# Patient Record
Sex: Male | Born: 1965 | Race: White | Hispanic: No | Marital: Single | Smoking: Current every day smoker
Health system: Southern US, Community
[De-identification: ages and names within clinical notes are randomized; demographics above are authoritative.]

## PROBLEM LIST (undated history)

## (undated) DIAGNOSIS — Z87442 Personal history of urinary calculi: Secondary | ICD-10-CM

## (undated) DIAGNOSIS — K219 Gastro-esophageal reflux disease without esophagitis: Secondary | ICD-10-CM

## (undated) DIAGNOSIS — E785 Hyperlipidemia, unspecified: Secondary | ICD-10-CM

## (undated) DIAGNOSIS — Z86718 Personal history of other venous thrombosis and embolism: Secondary | ICD-10-CM

## (undated) HISTORY — PX: CHOLECYSTECTOMY: SHX55

## (undated) HISTORY — DX: Personal history of other venous thrombosis and embolism: Z86.718

## (undated) HISTORY — DX: Gastro-esophageal reflux disease without esophagitis: K21.9

## (undated) HISTORY — DX: Hyperlipidemia, unspecified: E78.5

---

## 2004-04-08 HISTORY — PX: ARTERIAL THROMBECTOMY: SHX558

## 2005-03-15 ENCOUNTER — Inpatient Hospital Stay (HOSPITAL_COMMUNITY): Admission: EM | Admit: 2005-03-15 | Discharge: 2005-03-19 | Payer: Self-pay | Admitting: Emergency Medicine

## 2005-03-15 ENCOUNTER — Encounter (INDEPENDENT_AMBULATORY_CARE_PROVIDER_SITE_OTHER): Payer: Self-pay | Admitting: Specialist

## 2005-03-18 ENCOUNTER — Encounter (INDEPENDENT_AMBULATORY_CARE_PROVIDER_SITE_OTHER): Payer: Self-pay | Admitting: Cardiology

## 2005-04-29 ENCOUNTER — Ambulatory Visit (HOSPITAL_COMMUNITY): Admission: RE | Admit: 2005-04-29 | Discharge: 2005-04-29 | Payer: Self-pay | Admitting: Internal Medicine

## 2007-01-28 IMAGING — US US EXTREM LOW VENOUS*L*
1 series · 14 of 22 positions shown · non-contrast
Comparison: none

CLINICAL DATA: Left leg, ankle, and foot swelling and pain.
 LEFT LOWER EXTREMITY VENOUS DOPPLER ULTRASOUND:
TECHNIQUE: Gray-scale sonography with compression, as well as color and duplex Doppler ultrasound, were performed to evaluate the deep venous system from the level of the common femoral vein through the popliteal and proximal calf veins.

[Series 1: unknown · 14 of 22 slices shown]
[im 1/22]
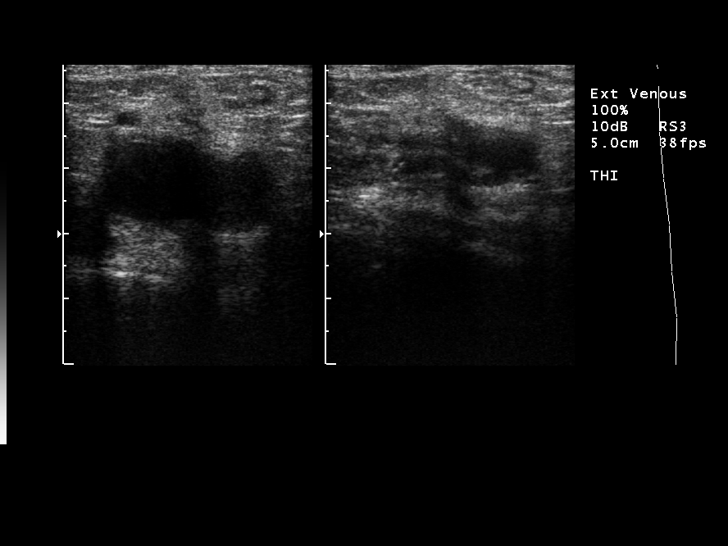
[im 3/22]
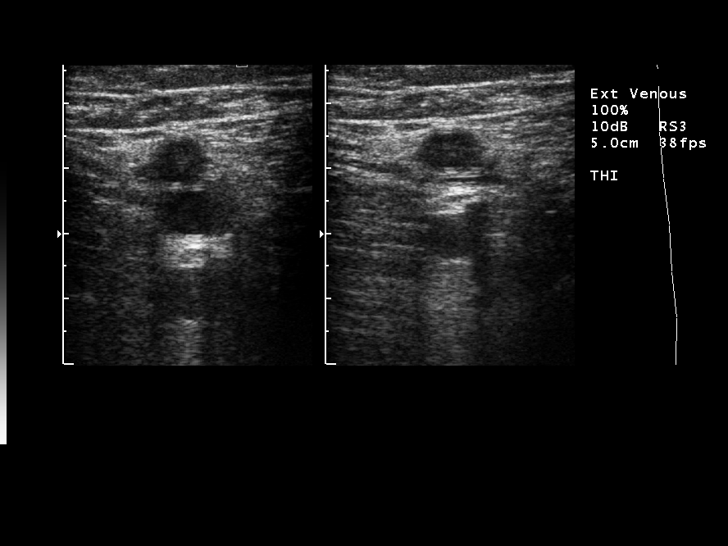
[im 4/22]
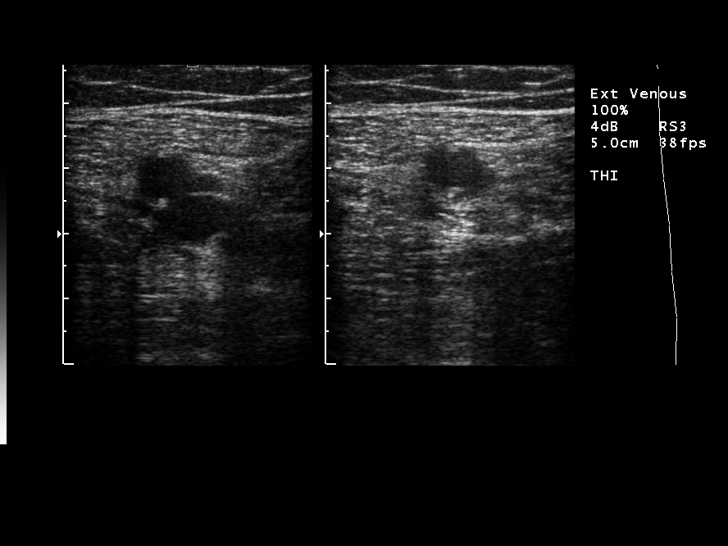
[im 6/22]
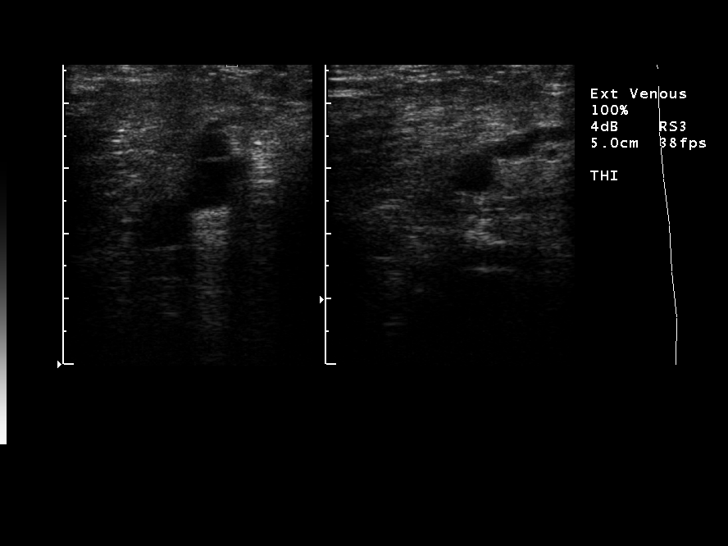
[im 8/22]
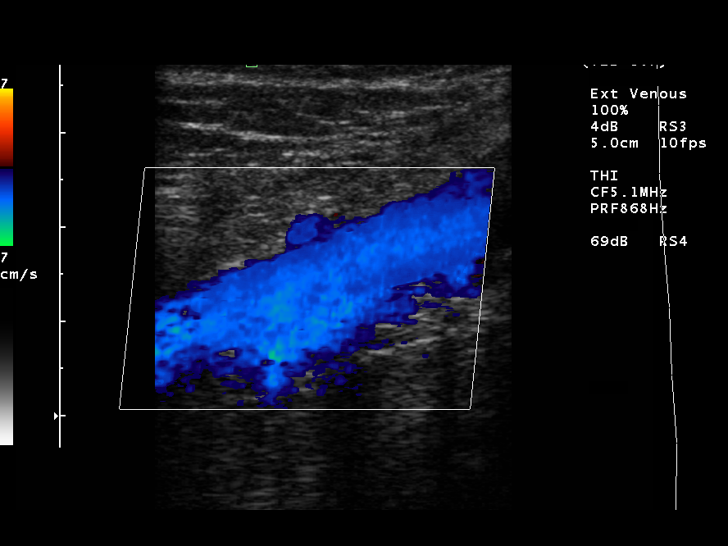
[im 9/22]
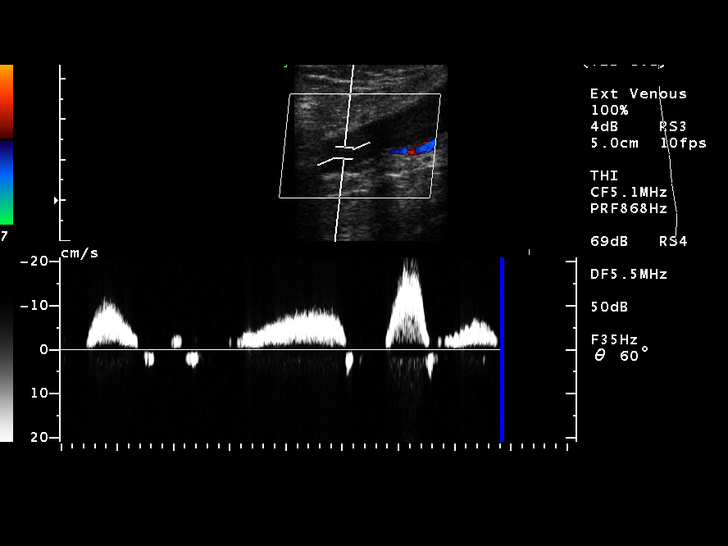
[im 11/22]
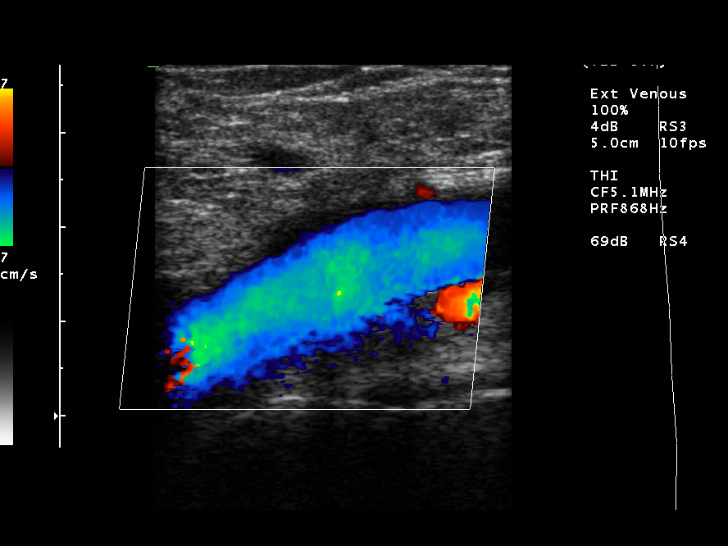
[im 12/22]
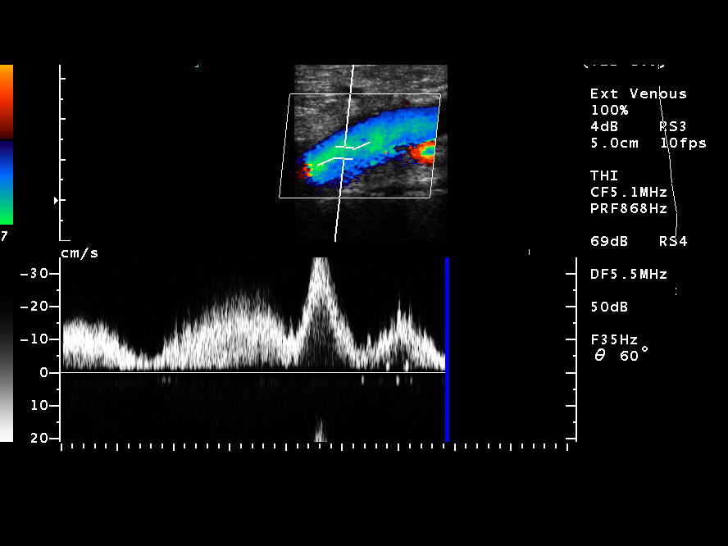
[im 14/22]
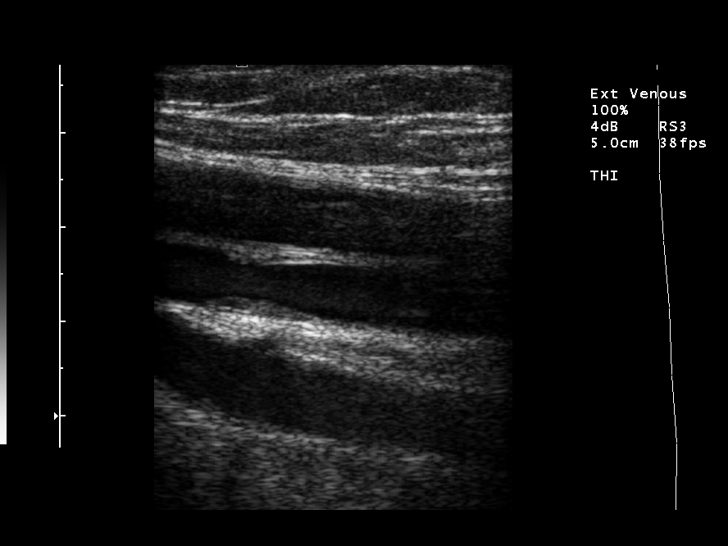
[im 15/22]
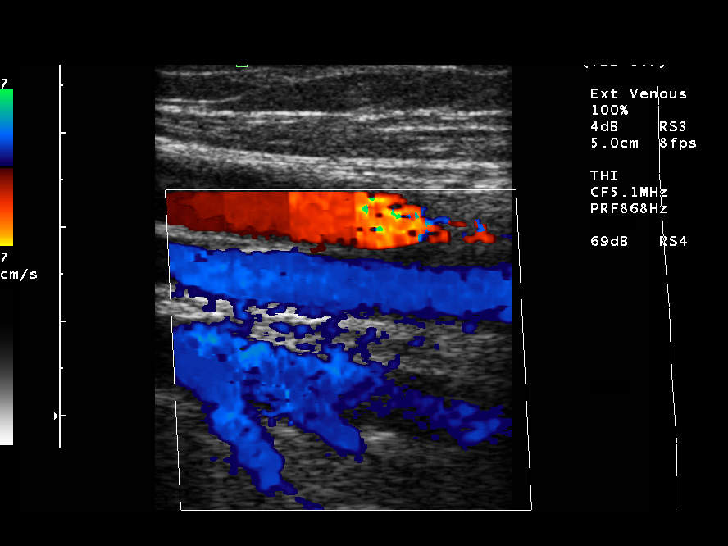
[im 17/22]
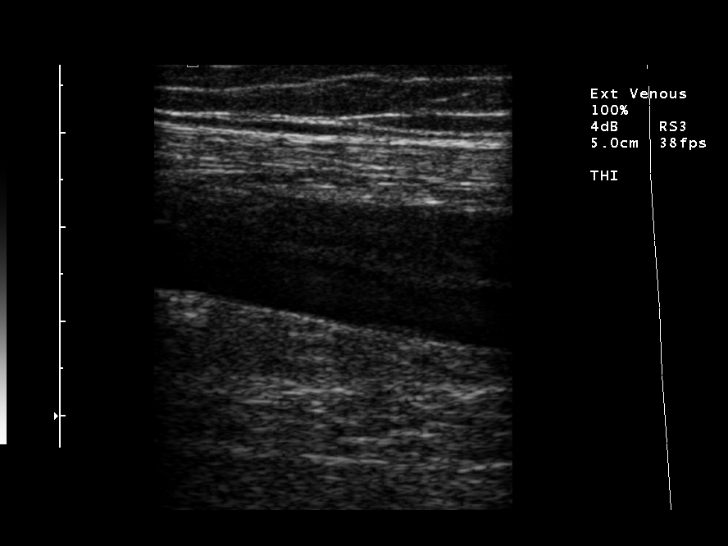
[im 19/22]
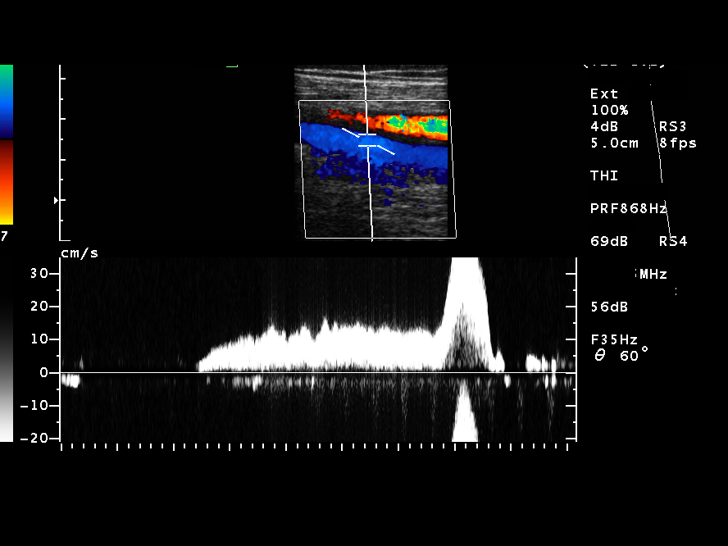
[im 20/22]
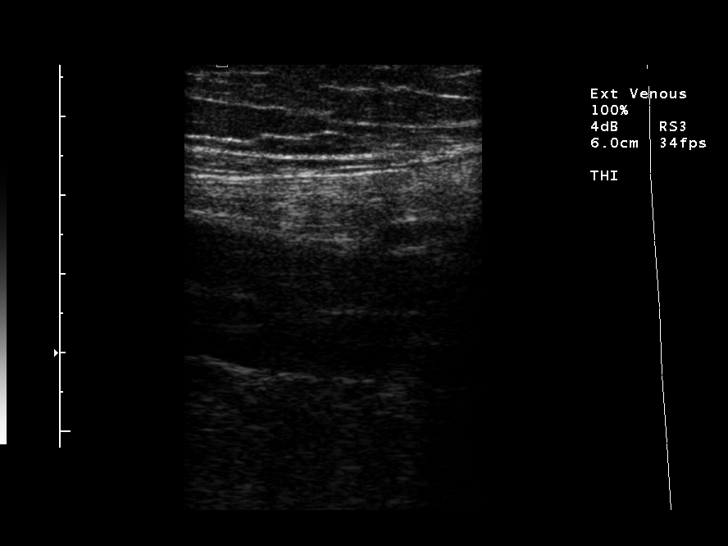
[im 22/22]
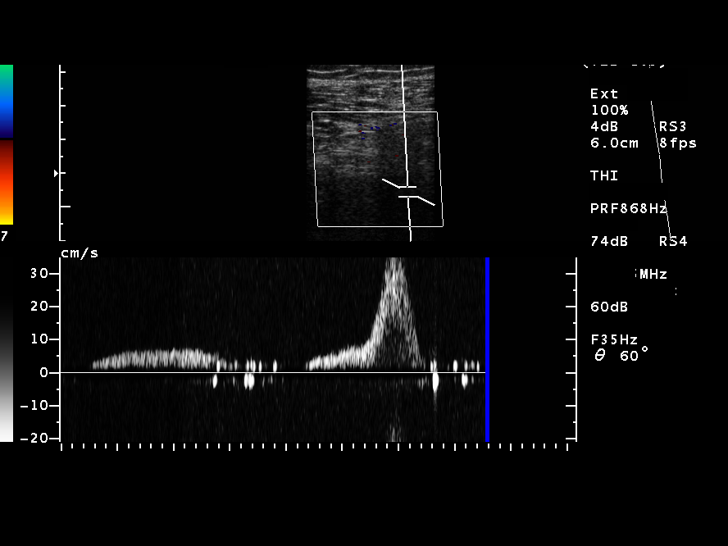

[14 of 22 positions shown; findings below may reference images not displayed]

FINDINGS: There is normal flow, compressibility, and augmentation in the left common femoral, superficial femoral, and popliteal veins.
IMPRESSION: No sonographic evidence of deep venous thrombosis in the visualized veins of the left lower extremity.

## 2007-07-16 ENCOUNTER — Ambulatory Visit (HOSPITAL_COMMUNITY): Admission: RE | Admit: 2007-07-16 | Discharge: 2007-07-16 | Payer: Self-pay | Admitting: Internal Medicine

## 2008-04-26 ENCOUNTER — Emergency Department (HOSPITAL_COMMUNITY): Admission: EM | Admit: 2008-04-26 | Discharge: 2008-04-26 | Payer: Self-pay | Admitting: Emergency Medicine

## 2008-05-17 ENCOUNTER — Encounter (HOSPITAL_COMMUNITY): Admission: RE | Admit: 2008-05-17 | Discharge: 2008-06-16 | Payer: Self-pay | Admitting: Family Medicine

## 2008-05-20 ENCOUNTER — Ambulatory Visit (HOSPITAL_COMMUNITY): Admission: RE | Admit: 2008-05-20 | Discharge: 2008-05-20 | Payer: Self-pay | Admitting: General Surgery

## 2008-05-20 ENCOUNTER — Encounter (INDEPENDENT_AMBULATORY_CARE_PROVIDER_SITE_OTHER): Payer: Self-pay | Admitting: General Surgery

## 2010-07-23 LAB — DIFFERENTIAL
Basophils Relative: 0 % (ref 0–1)
Eosinophils Absolute: 0.1 10*3/uL (ref 0.0–0.7)
Monocytes Relative: 8 % (ref 3–12)
Neutrophils Relative %: 63 % (ref 43–77)

## 2010-07-23 LAB — CBC
Hemoglobin: 15.5 g/dL (ref 13.0–17.0)
RBC: 4.49 MIL/uL (ref 4.22–5.81)
WBC: 7.9 10*3/uL (ref 4.0–10.5)

## 2010-07-23 LAB — COMPREHENSIVE METABOLIC PANEL
ALT: 20 U/L (ref 0–53)
Alkaline Phosphatase: 64 U/L (ref 39–117)
CO2: 27 mEq/L (ref 19–32)
GFR calc non Af Amer: >60 mL/min (ref >60)
Glucose, Bld: 89 mg/dL (ref 70–99)
Potassium: 3.9 mEq/L (ref 3.5–5.1)
Sodium: 132 mEq/L — ABNORMAL LOW (ref 135–145)
Total Protein: 7.1 g/dL (ref 6.0–8.3)

## 2010-07-23 LAB — URINE MICROSCOPIC-ADD ON

## 2010-07-23 LAB — URINALYSIS, ROUTINE W REFLEX MICROSCOPIC
Glucose, UA: NEGATIVE mg/dL
Specific Gravity, Urine: 1.025 (ref 1.005–1.030)

## 2010-08-21 NOTE — H&P (Signed)
NAME:  Jordan Key, Jordan Key NO.:  [id number]   MEDICAL RECORD NO.:  [id number]          PATIENT TYPE:  AMB   LOCATION:  DAY                           FACILITY:  [facility]   PHYSICIAN:  Dalia Heading, M.D.  DATE OF BIRTH:  17-Jan-1966   DATE OF ADMISSION:  DATE OF DISCHARGE:  LH                              HISTORY & PHYSICAL   CHIEF COMPLAINT:  Chronic cholecystitis.   HISTORY OF PRESENT ILLNESS:  The patient is a 45 year old white male who  is referred for evaluation and treatment of biliary colic secondary to  chronic cholecystitis.  He has been having intermittent right upper  quadrant abdominal pain with radiation to the right flank, nausea, and  indigestion for the past few months.  His symptoms are worsening and  becoming more frequent.  They are made worse with fatty foods.  No  fever, chills, or jaundice have been noted.   PAST MEDICAL HISTORY:  Is remarkable only for an unknown surgery for a  blood clot.   PAST SURGICAL HISTORY:  As noted above.   CURRENT MEDICATIONS:  Hydrocodone and Phenergan.   ALLERGIES:  PENICILLIN.   REVIEW OF SYSTEMS:  The patient smokes a pack cigarettes a day.  He also  drinks alcohol socially.  Denies any other cardiopulmonary difficulties  or bleeding disorders except as noted.   PHYSICAL EXAMINATION:  The patient is a well-developed, well-nourished,  white male in no acute distress.  HEENT EXAMINATION:  Reveals no scleral icterus.  LUNGS:  Clear to auscultation with equal breath sounds bilaterally.  HEART EXAMINATION:  Reveals a regular rate and rhythm without S3, S4, or  murmurs.  ABDOMEN:  Is soft and nondistended.  He is tender in the right upper  quadrant to palpation.  No hepatosplenomegaly, masses, hernias are  identified.   Ultrasound of the gallbladder reveals sludge with a normal common bile  duct.  HIDA scan reveals a normal common gallbladder ejection fraction,  reproducible symptoms which occurred after the  procedure requiring pain  medication.   IMPRESSION:  Chronic cholecystitis.   PLAN:  The patient is scheduled for laparoscopic cholecystectomy on  May 20, 2008.  Risks and benefits of the procedure including  bleeding, infection, hepatobiliary injury, the possibility of recurrence  of symptoms, and the possibility of an open procedure were fully  explained to the patient, who gave informed consent.   </PAST      Dalia Heading, M.D.  Electronically Signed     MAJ/MEDQ  D:  05/19/2008  T:  05/19/2008  Job:  [id number]   cc:   [facility]  Fax: [phone]   Madelin Rear. Sherwood Gambler, MD  Fax: [phone]   Dalia Heading, M.D.  Fax: [phone]

## 2010-08-21 NOTE — Op Note (Signed)
NAME:  BREYLIN, DOM NO.:  [id number]   MEDICAL RECORD NO.:  [id number]          PATIENT TYPE:  AMB   LOCATION:  DAY                           FACILITY:  [facility]   PHYSICIAN:  Dalia Heading, M.D.  DATE OF BIRTH:  October 03, 1965   DATE OF PROCEDURE:  05/20/2008  DATE OF DISCHARGE:                               OPERATIVE REPORT   PREOPERATIVE DIAGNOSIS:  Chronic cholecystitis.   POSTOPERATIVE DIAGNOSIS:  Chronic cholecystitis.   PROCEDURE:  Laparoscopic cholecystectomy.   SURGEON:  Dalia Heading, MD   ANESTHESIA:  General endotracheal.   INDICATIONS:  The patient is a 45 year old white male who presents with  right upper quadrant pain secondary to chronic cholecystitis.  The risks  and benefits of the procedure including bleeding, infection, pain,  hepatobiliary injury, and the possibility of an open procedure were  fully explained to the patient, gave informed consent.   PROCEDURE NOTE:  The patient was placed in the supine position.  After  induction of general endotracheal anesthesia, the abdomen was prepped  and draped using the usual sterile technique with Betadine.  Surgical  site confirmation was performed.   A supraumbilical incision was made down to the fascia.  Veress needle  was introduced into the abdominal cavity and confirmation of placement  was done using the saline drop test.  The abdomen was then insufflated  to 16 mmHg pressure.  An 11-mm trocar was introduced into the abdominal  cavity under direct visualization without difficulty.  The patient was  placed in reversed Trendelenburg position.  An additional 11-mm trocar  was placed in the epigastric region and 5-mm trocar was placed in the  right upper quadrant and right flank regions.  Liver was inspected and  noted to be within normal limits.  There were multiple adhesions around  the gallbladder.  These were freed away without difficulty.  The  gallbladder was retracted superiorly and  laterally.  The dissection was  begun around the infundibulum of the gallbladder.  The cystic duct was  first identified.  The juncture to the infundibulum was fully  identified.  EndoClips were placed proximally and distally on the cystic  duct, and the cystic duct was divided.  This likewise done in the cystic  artery.  The gallbladder was then freed away from the gallbladder fossa  using Bovie electrocautery.  The gallbladder was delivered through the  epigastric trocar site using Endocatch bag.  The gallbladder fossa was  inspected.  No abnormal bleeding or bile leakage was noted.  Surgicel  was placed in the gallbladder fossa.  All fluid and air were then  evacuated from the abdominal cavity prior to removal of the trocars.   All wounds were irrigated with normal saline.  All wounds were injected  with 0.5% Sensorcaine.  The supraumbilical fascia as well as the  epigastric fascia were reapproximated using 0 Vicryl in interrupted  sutures.  All skin incisions were closed using staples.  Betadine  ointment and dry sterile dressings were applied.   All tape and needle counts were correct at the end of  the procedure.  The patient was extubated in the operating room and went back to  recovery room awake in stable condition.   COMPLICATIONS:  None.   SPECIMEN:  Gallbladder.   BLOOD LOSS:  Minimal.      Dalia Heading, M.D.  Electronically Signed     MAJ/MEDQ  D:  05/20/2008  T:  05/21/2008  Job:  [id number]   cc:   Corrie Mckusick, M.D.  Fax: [phone]   Madelin Rear. Sherwood Gambler, MD  Fax: [phone]

## 2010-08-24 NOTE — H&P (Signed)
NAME:  Jordan Key, Jordan Key NO.:  [id number]   MEDICAL RECORD NO.:  [id number]          PATIENT TYPE:  INP   LOCATION:  5715                         FACILITY:  [facility]   PHYSICIAN:  Jordan Key, M.D.    DATE OF BIRTH:  [birth year]   DATE OF ADMISSION:  03/15/2005  DATE OF DISCHARGE:                                HISTORY & PHYSICAL   PRIMARY PHYSICIAN:  Jordan Key, M.D.  at [facility]  in [place].   CHIEF COMPLAINT:  Right leg pain and numbness since 4 p.m. on March 15, 2005.   HISTORY OF PRESENT ILLNESS:  Jordan Key is a 45 year old Caucasian male who  has been otherwise healthy but developed severe nausea, vomiting, diarrhea  on March 13, 2005.  He reports that he was having either vomiting or  diarrhea approximately every 15 minutes all throughout the night and around  7 a.m. the following morning was starting to have a little relief.  Nonetheless, he presented to [facility] on Thursday,  March 14, 2005 and saw the physician assistant who treated him for  gastroenteritis and gave him a prescription for Phenergan as needed.  Of  note, he had also been febrile with a temperature of 102 the night before,  however, this too was improving.  On March 15, 2005 around 4 p.m. while  watching TV, he developed acute onset of right groin pain and numbness in  his entire right leg.  His motor function remained intact.  At that time  there was no pain in his left leg.  He then presented to [facility]  for further evaluation.  Exam was consistent with an ischemic right foot and  Jordan Key was consulted at [facility] and agreed to accept him  in transfer so he could be further assessed.  Once examined by Jordan Key, he  was now noted to have coolness of his left foot, although he was without  other symptoms.  He was also without sensation of his right leg from the  knee distally.  There were no Doppler  signals of his right foot and dampened  Doppler signals of his left posterior tibial and dorsalis pedis pulses.  He  denied any prior history of deep vein thrombosis, claudication, or leg pain.  Based on exam, Jordan Key felt he should be taken to the operating room  emergently for limb salvage with bilateral lower extremity embolectomies.   PAST MEDICAL HISTORY:  Recently treated for gastroenteritis as mentioned  otherwise negative.   PAST SURGICAL HISTORY:  Wisdom tooth extraction.   ALLERGIES:  PENICILLIN, unknown reaction but believes he was told that he  had trouble breathing as a child with penicillin.   MEDICATIONS:  Phenergan p.o. p.r.n. which was prescribed on March 13, 2005.   REVIEW OF SYSTEMS:  See HPI for pertinent positives and negatives.  In  addition, he denies chest pain, shortness of breath, dysuria, hematochezia.  He also reports that he has had no further GI symptoms since Thursday.   SOCIAL HISTORY:  He  is engaged.  He has two biological children and his  fiance has two children of her own.  He lives with his fiance.  He smokes  one pack of cigarettes per day for the past 20 years.  He admits to drinking  seven beers per day.  He has experimented with marijuana and speed in the  past but not in several years.  He is currently employed at __________  [employer] in [place].   FAMILY HISTORY:  Unknown as he is adopted.   PHYSICAL EXAMINATION:  Performed postoperatively, see HPI for Jordan Key  physical exam findings preoperatively.  VITAL SIGNS:  Blood pressure 91/62, heart rate 103, respirations 20,  temperature 98.3, oxygen saturation 98% on room air.  GENERAL APPEARANCE:  This is a 45 year old Caucasian male who appears his  stated age.  He is alert, cooperative, in no acute distress.  HEENT:  His head is normocephalic, atraumatic.  Pupils equal, round,  reactive to light.  Sclerae are nonicteric.  Oral mucosa is pink and moist.  He does have what  appears to be mild bruising of his left posterior pharynx  presumed secondary to intubation.  He has rather poor dentition.  NECK:  Supple.  He has palpable carotid pulses.  No bruits were auscultated.  No lymphadenopathy was noted.  He does have some mild right subclavicular  swelling but this does not appear to be secondary to lymphadenopathy or a  mass.  It feels more like soft tissue swelling.  RESPIRATORY:  Lung sounds are clear throughout, unlabored and symmetric on  inspiration.  CARDIAC:  His heart has a regular rate and rhythm.  No murmur, rub, or  gallop was noted.  ABDOMEN:  His abdomen is soft, nontender, nondistended.  I was unable to  appreciate any hepatomegaly.  He does have hypoactive bowel sounds.  GU:  Deferred.  RECTAL:  Deferred.  EXTREMITIES:  No significant edema.  His bilateral groin and left leg  dressings are dry and intact with no fresh drainage.  He has evidence of  hematoma.  He has 2+ radial, dorsalis pedis, and posterior tibialis pulses  bilaterally.  His right toes are cool but no cyanosis noted.  Otherwise the  extremities are warm.  NEUROLOGIC:  Grossly intact.  He is alert and oriented x4.  His speech is  clear.  He moves all extremities x4.   ASSESSMENT:  Bilateral lower extremity ischemia, right greater than the left  with uncertain etiology.  No known evidence of cardiogenic source at this  time.  Other etiologies to consider are severe dehydration with  hypercoagulable state.   PLAN:  1.  Admitted to [facility] and taken to the operating room      emergently by Jordan Key for bilateral      femoral and popliteal embolectomies for limb salvage.  2.  Postoperatively, we will plan a 2D echocardiogram to rule out a cardiac      source of embolus.  3.  Smoking cessation was also encouraged.      Jordan Key, P.A.      Jordan Key, M.D.  Electronically Signed    Jordan Key  D:  03/16/2005  T:  03/16/2005  Job:   [id number]  cc:   patient's hospital chart   Jordan Key, M.D.  [address]  [place]  [place] [address]   Jordan Rear. Sherwood Gambler, MD  Fax: [phone]

## 2010-08-24 NOTE — Discharge Summary (Signed)
NAME:  Jordan Key, Jordan Key NO.:  [id number]   MEDICAL RECORD NO.:  [id number]          PATIENT TYPE:  INP   LOCATION:  5715                         FACILITY:  [facility]   PHYSICIAN:  Larina Earthly, M.D.    DATE OF BIRTH:  Sep 04, 1965   DATE OF ADMISSION:  03/15/2005  DATE OF DISCHARGE:  03/19/2005                                 DISCHARGE SUMMARY   PRIMARY DISCHARGE DIAGNOSIS:  Bilateral lower extremity arterial  insufficiency.   SECONDARY DIAGNOSES:  1.  History of gastroenteritis.  2.  Status post wisdom tooth extraction.   ALLERGIES:  PENICILLIN.   IN-HOSPITAL OPERATIONS AND PROCEDURES:  Bilateral femoral embolectomy and  left popliteal artery exploration of tibial embolectomy.   HISTORY AND PHYSICAL AND HOSPITAL COURSE:  Mr. Jordan Key is a 45 year old  Caucasian male who presented to [facility] emergency room on March 15, 2005, with complaints of acute onset of right groin pain and numbness in his  entire right leg.  [facility] diagnosed him with ischemic right foot and  consulted Dr. Arbie Cookey.  On presentation to [facility], the patient stated that  his left foot was cold.  He also complained that he did not have any  sensation of his right leg from the knee down.  He denied any decreased  motor function.  Patient seen and evaluated by Dr. Arbie Cookey.  Dr. Arbie Cookey  discussed with the patient taking him emergently to the operating room to  undergo bilateral femoral-popliteal embolectomies for limb salvage.  The  patient agreed.  The patient was recently treated for gastroenteritis, which  presented March 13, 2005, where he had complaints of nausea, vomiting and  diarrhea.  December 8, the patient states that this has improved.   The patient was taken emergently to the operating room March 15, 2005,  where he underwent bilateral femoral embolectomy and left popliteal artery  exploration with tibial embolectomy.  The patient tolerated this procedure  well and was  transferred to PACU in stable condition.  The patient's  postoperative course was pretty much unremarkable.  He did have pain control  issues and was placed on Dilaudid PCA.  This did improve patient's pain and  did allow him to get out of bed and ambulate.  The patient's incision  remained dry and intact and healing well postoperatively.  He did have 2+  bilateral DP, PT pulses noted postoperatively.  These did remain during  patient's hospital stay.  Postoperative ABIs were obtained showing the right  to be greater than 1 as well as the left to be greater than 1.  A 2-D echo  was ordered postoperatively for evaluation.  This is currently still  pending.  The patient remained in normal sinus rhythm.  He was able to be  weaned from oxygen saturating greater than 90% on room air.  He remained  hemodynamically stable following surgery.   The patient seemed to be ready for discharge March 19, 2005,  postoperative day 4.  A follow-up appointment will be scheduled with Dr.  Arbie Cookey for in three weeks.  The patient will  obtain postoperative ABIs at  this appointment.  Mr. Ledvina received instructions on diet, activity level  and incisional care.  He was told no driving until released to do so, no  heavy lifting over 10 pounds.  The patient was told he was allowed to  shower, washing his incisions using soap and water.  He is to contact the  office if he develops any drainage or opening from any of his incision  sites.  The patient acknowledges understanding.  The patient was told to ambulate  three to four times per day, progress as tolerated.  A walker will be  arranged for home use.   DISCHARGE MEDICATIONS:  Oxycodone 5 mg one to two tablets p.o. q.4-6h.  p.r.n. pain.      Theda [place], [place]      Larina Earthly, M.D.  Electronically Signed    KMD/MEDQ  D:  03/18/2005  T:  03/19/2005  Job:  [id number]

## 2010-08-24 NOTE — Op Note (Signed)
NAME:  Jordan Key, Jordan Key NO.:  [id number]   MEDICAL RECORD NO.:  [id number]          PATIENT TYPE:  INP   LOCATION:  5715                         FACILITY:  [facility]   PHYSICIAN:  Larina Earthly, M.D.    DATE OF BIRTH:  11-15-1965   DATE OF PROCEDURE:  03/15/2005  DATE OF DISCHARGE:                                 OPERATIVE REPORT   PREOPERATIVE DIAGNOSIS:  Bilateral lower extremity arterial insufficiency.   POSTOPERATIVE DIAGNOSIS:  Bilateral lower extremity arterial insufficiency.   PROCEDURE:  Bilateral femoral embolectomy and left popliteal artery  exploration with tibial embolectomy.   SURGEON:  Larina Earthly, M.D.   ASSISTANT:  Nurse.   ANESTHESIA:  General endotracheal.   COMPLICATIONS:  None.   DISPOSITION:  To recovery room stable.   INDICATIONS FOR PROCEDURE:  The patient is a 45 year old gentleman with a 48-  hour history of severe nausea, vomiting and diarrhea.  He presented to [facility] Emergency Room with profound ischemia of the right foot and moderate  ischemia of the left foot.  He is transferred to [facility] for  further evaluation.  This was begun at approximately 4:00 p.m. on the  evening of this procedure.  He had a motor intact on the right but no  sensory function on the right foot.  He did have motor sensory function on  the left foot.  No right femoral pulse and no left popliteal or distal  pulses bilaterally.  It was recommended that he undergo emergent operation  for limb salvage.   PROCEDURE IN DETAIL:  The patient was taken to the operating room and placed  in the supine position.  There both groins were prepped and draped in the  usual sterile fashion.  The patient was given 7000 units of intravenous  heparin.  An oblique incision was made at the level of the inguinal crease  on the right, carried down to isolate the common superficial, femoral and  profundus femoris arteries.  The patient did not have a femoral  pulse.  The  artery was opened transversely, and there was no clot in the femoral artery.  A 4 Fogarty catheter was passed proximally into the iliac system and was  withdrawn, removing the clot.  This gave excellent inflow.  The path was  negative for clot, and the artery was occluded proximally.  Next, the  Fogarty catheter was passed all the way down to the foot and no clot was  removed, and there was good backbleeding.  Incision in the artery was closed  with a running 6-0 Prolene suture.  Clamps were removed and good Doppler  signals was noted in the foot.  Next, attention was turned to the left  groin.  A similar incision was made obliquely at the level of the inguinal  crease, carried down to isolate the femoral artery. The patient did have a  palpable femoral pulse.  The artery was occluded proximally and distally.  It was opened by transversely with an 11 blade.  The 4 Fogarty catheter was  passed proximally, and no  clot was obtained proximally.  This was  reoccluded.  Next, Fogarty catheter was passed down to the level of foot,  and thrombus was removed and appeared to be at the level of popliteal by  digital pressure.  When no further clot was removed, the intraoperative  arteriogram was obtained through the arteriotomy in the femoral artery.  This revealed occlusion of the popliteal artery.  For this reason, the  femoral arteriotomy was closed with a running 6-0 Prolene suture.  Incision  was made at the medial aspect popliteal artery in the below-knee popliteal  artery.  The anterior tibial and tibioperoneal trunks were encircled with  vessel loops.  The popliteal artery was occluded proximally and was opened  transversely. A 3 Fogarty catheter was passed down to the level of the foot  via the anterior tibial artery and further embolus was removed.  This was  repeated, and a negative pass was obtained.  This was reoccluded.  Next, the  peroneal and posterior tibial arteries were  thrombectomized.  Again, after a  negative pass, these were reoccluded.  The incision in the below-knee  popliteal artery was closed with interrupted 6-0 Prolene sutures.  Clamps  were removed and excellent Doppler signal noted in the foot.  The patient's  heparin was not reversed.  The wounds were irrigated with saline.  Hemostasis with electrocautery.  Wounds were closed with 2-0 Vicryl in the  subcutaneous tissue in several layers.  Skin was closed 3-0 subcuticular  Vicryl stitch.  A sterile dressing was applied.  The patient was taken to  the recovery in stable condition.      Larina Earthly, M.D.  Electronically Signed     TFE/MEDQ  D:  03/15/2005  T:  03/16/2005  Job:  [id number]

## 2012-07-21 ENCOUNTER — Other Ambulatory Visit (HOSPITAL_COMMUNITY): Payer: Self-pay | Admitting: Internal Medicine

## 2012-07-21 DIAGNOSIS — R609 Edema, unspecified: Secondary | ICD-10-CM

## 2012-07-22 ENCOUNTER — Ambulatory Visit (HOSPITAL_COMMUNITY): Payer: Self-pay

## 2015-05-15 ENCOUNTER — Encounter (INDEPENDENT_AMBULATORY_CARE_PROVIDER_SITE_OTHER): Payer: Self-pay | Admitting: *Deleted

## 2018-01-08 ENCOUNTER — Ambulatory Visit: Payer: Medicaid Other | Admitting: Physician Assistant

## 2018-01-08 ENCOUNTER — Encounter: Payer: Self-pay | Admitting: Physician Assistant

## 2018-01-08 VITALS — BP 134/73 | HR 103 | Temp 97.7°F | Ht 70.75 in | Wt 188.0 lb

## 2018-01-08 DIAGNOSIS — K0889 Other specified disorders of teeth and supporting structures: Secondary | ICD-10-CM

## 2018-01-08 DIAGNOSIS — K219 Gastro-esophageal reflux disease without esophagitis: Secondary | ICD-10-CM

## 2018-01-08 DIAGNOSIS — F17200 Nicotine dependence, unspecified, uncomplicated: Secondary | ICD-10-CM

## 2018-01-08 DIAGNOSIS — Z8639 Personal history of other endocrine, nutritional and metabolic disease: Secondary | ICD-10-CM

## 2018-01-08 DIAGNOSIS — Z7689 Persons encountering health services in other specified circumstances: Secondary | ICD-10-CM

## 2018-01-08 DIAGNOSIS — Z125 Encounter for screening for malignant neoplasm of prostate: Secondary | ICD-10-CM

## 2018-01-08 MED ORDER — CLINDAMYCIN HCL 300 MG PO CAPS: 300.0000 mg | ORAL_CAPSULE | Freq: Four times a day (QID) | ORAL | 0 refills | Status: DC

## 2018-01-08 NOTE — Progress Notes (Signed)
BP 134/73 (BP Location: Right Arm, Patient Position: Sitting, Cuff Size: Normal)   Pulse (!) 103   Temp 97.7 F (36.5 C)   Ht 5' 10.75" (1.797 m)   Wt 188 lb (85.3 kg)   SpO2 95%   BMI 26.41 kg/m    Subjective:    Patient ID: Jordan Key, male    DOB: May 18, 1965, 53 y.o.   MRN: [id number]  HPI: Jordan Key is a 52 y.o. male presenting on 01/08/2018 for New Patient (Initial Visit) (previous pt with [facility] medical but lost job in May and has not been able to afford to go back. pt last seen there around March) and Dental Pain   HPI   Chief Complaint  Patient presents with  . New Patient (Initial Visit)    previous pt with [facility] medical but lost job in May and has not been able to afford to go back. pt last seen there around March  . Dental Pain   Pt was on cholesterol medication but his lipids improved so his medication was discontinued.    Relevant past medical, surgical, family and social history reviewed and updated as indicated. Interim medical history since our last visit reviewed. Allergies and medications reviewed and updated.  CURRENT MEDS: Tylenol IBU OTC nexium  Review of Systems  Constitutional: Negative for appetite change, chills, diaphoresis, fatigue, fever and unexpected weight change.  HENT: Positive for dental problem and facial swelling. Negative for congestion, drooling, ear pain, hearing loss, mouth sores, sneezing, sore throat, trouble swallowing and voice change.   Eyes: Negative for pain, discharge, redness, itching and visual disturbance.  Respiratory: Negative for cough, choking, shortness of breath and wheezing.   Cardiovascular: Negative for chest pain, palpitations and leg swelling.  Gastrointestinal: Negative for abdominal pain, blood in stool, constipation, diarrhea and vomiting.  Endocrine: Negative for cold intolerance, heat intolerance and polydipsia.  Genitourinary: Negative for decreased urine volume, dysuria and hematuria.   Musculoskeletal: Positive for arthralgias. Negative for back pain and gait problem.  Skin: Negative for rash.  Allergic/Immunologic: Positive for environmental allergies.  Neurological: Negative for seizures, syncope, light-headedness and headaches.  Hematological: Negative for adenopathy.  Psychiatric/Behavioral: Negative for agitation, dysphoric mood and suicidal ideas. The patient is not nervous/anxious.     Per HPI unless specifically indicated above     Objective:    BP 134/73 (BP Location: Right Arm, Patient Position: Sitting, Cuff Size: Normal)   Pulse (!) 103   Temp 97.7 F (36.5 C)   Ht 5' 10.75" (1.797 m)   Wt 188 lb (85.3 kg)   SpO2 95%   BMI 26.41 kg/m   Wt Readings from Last 3 Encounters:  01/08/18 188 lb (85.3 kg)    Physical Exam  Constitutional: He is oriented to person, place, and time. He appears well-developed and well-nourished.  HENT:  Head: Normocephalic and atraumatic.  Mouth/Throat: Uvula is midline and oropharynx is clear and moist. No trismus in the jaw. Abnormal dentition. Dental caries present. No uvula swelling. No oropharyngeal exudate.  Eyes: Pupils are equal, round, and reactive to light. Conjunctivae and EOM are normal.  Neck: Neck supple. No thyromegaly present.  Cardiovascular: Normal rate and regular rhythm.  Pulmonary/Chest: Effort normal and breath sounds normal. He has no wheezes. He has no rales.  Abdominal: Soft. Bowel sounds are normal. He exhibits no mass. There is no hepatosplenomegaly. There is no tenderness.  Musculoskeletal: He exhibits no edema.  Lymphadenopathy:    He has no cervical adenopathy.  Neurological: He is alert and oriented to person, place, and time.  Skin: Skin is warm and dry. No rash noted.  Psychiatric: He has a normal mood and affect. His behavior is normal. Thought content normal.  Vitals reviewed.       Assessment & Plan:   Encounter Diagnoses  Name Primary?  . Encounter to establish care Yes  .  Dentalgia   . History of hyperlipidemia   . Tobacco use disorder   . Screening for prostate cancer   . Gastroesophageal reflux disease, esophagitis presence not specified     -will get baseline labs -rx clindamycin for dental infection -will refer pt to dental list -pt counseled on smoking cessation -pt to follow up 1 month to review labs.  RTO sooner prn

## 2018-01-28 ENCOUNTER — Ambulatory Visit: Payer: Self-pay | Admitting: Physician Assistant

## 2018-02-04 ENCOUNTER — Encounter: Payer: Self-pay | Admitting: Physician Assistant

## 2018-09-29 ENCOUNTER — Telehealth: Payer: Self-pay | Admitting: Physician Assistant

## 2018-11-25 ENCOUNTER — Encounter: Payer: Self-pay | Admitting: Internal Medicine

## 2018-12-10 ENCOUNTER — Telehealth: Payer: Self-pay | Admitting: Physician Assistant

## 2018-12-22 ENCOUNTER — Encounter: Payer: Self-pay | Admitting: Gastroenterology

## 2018-12-22 ENCOUNTER — Other Ambulatory Visit: Payer: Self-pay

## 2018-12-22 ENCOUNTER — Ambulatory Visit (INDEPENDENT_AMBULATORY_CARE_PROVIDER_SITE_OTHER): Payer: BC Managed Care – PPO | Admitting: Gastroenterology

## 2018-12-22 DIAGNOSIS — K625 Hemorrhage of anus and rectum: Secondary | ICD-10-CM | POA: Diagnosis not present

## 2018-12-22 NOTE — Progress Notes (Signed)
Primary Care Physician:  Redmond School, MD Primary Gastroenterologist:  Dr. Gala Romney  Chief Complaint  Patient presents with  . Consult    TCS. never had done prior  . Rectal Bleeding    last episode was sunday  . Constipation    started taking stool softners and it has helped    HPI:   Jordan Key is a 53 y.o. male presenting today at the request of Dr. Gerarda Fraction due to rectal bleeding. No prior colonoscopy.   Rectal bleeding onset about August 10th. States first few episodes small amount, then large amount. Was given antibiotics by PCP empirically. Went back to work. Rectal bleeding recurred. For about a week had blood with BMs. Prescribed hydrocodone for pain. Rectal discomfort. Improved. No bleeding since Sunday. Bleeding tapered off from last week. Had some vomiting and nausea a few weeks ago. For past few days still with rectal discomfort but has helped by taking stool softeners. If any firmness to stool, will have pain and bleeding. Works as Animal nutritionist. Feels like glass when passing BM.    3-4 months ago weighed 195-200, now 183. Unintentional weight loss.   Past Medical History:  Diagnosis Date  . GERD (gastroesophageal reflux disease)   . Hx of blood clots   . Hyperlipidemia     Past Surgical History:  Procedure Laterality Date  . ARTERIAL THROMBECTOMY  2006  . CHOLECYSTECTOMY      Current Outpatient Medications  Medication Sig Dispense Refill  . Acetaminophen (TYLENOL PO) Take by mouth.    . ANUCORT-HC 25 MG suppository as needed.    Marland Kitchen aspirin 81 MG chewable tablet Chew by mouth daily.    Mariane Baumgarten Sodium (STOOL SOFTENER) 100 MG capsule Take 100-200 mg by mouth daily.    Marland Kitchen esomeprazole (NEXIUM) 20 MG capsule Take 20 mg by mouth daily at 12 noon.    . hydrocortisone cream 1 % as needed.    . Ibuprofen (IBU PO) Take by mouth.    . levofloxacin (LEVAQUIN) 500 MG tablet Take 1 tablet by mouth daily.    . traMADol (ULTRAM) 50 MG tablet Take 50 mg by mouth  every 6 (six) hours as needed.     No current facility-administered medications for this visit.     Allergies as of 12/22/2018 - Review Complete 12/22/2018  Allergen Reaction Noted  . Penicillins Anaphylaxis 01/08/2018  . Morphine and related Itching and Swelling 01/08/2018  . Sulfur Itching and Swelling 01/08/2018    Family History  Adopted: Yes    Social History   Socioeconomic History  . Marital status: Married    Spouse name: Not on file  . Number of children: Not on file  . Years of education: Not on file  . Highest education level: Not on file  Occupational History  . Not on file  Social Needs  . Financial resource strain: Not on file  . Food insecurity    Worry: Not on file    Inability: Not on file  . Transportation needs    Medical: Not on file    Non-medical: Not on file  Tobacco Use  . Smoking status: Current Every Day Smoker    Packs/day: 0.50    Years: 37.00    Pack years: 18.50    Types: Cigarettes  . Smokeless tobacco: Never Used  Substance and Sexual Activity  . Alcohol use: Not Currently    Comment: history of regular ETOH use but stopped a few years ago  .  Drug use: Never  . Sexual activity: Not on file  Lifestyle  . Physical activity    Days per week: Not on file    Minutes per session: Not on file  . Stress: Not on file  Relationships  . Social Musicianconnections    Talks on phone: Not on file    Gets together: Not on file    Attends religious service: Not on file    Active member of club or organization: Not on file    Attends meetings of clubs or organizations: Not on file    Relationship status: Not on file  . Intimate partner violence    Fear of current or ex partner: Not on file    Emotionally abused: Not on file    Physically abused: Not on file    Forced sexual activity: Not on file  Other Topics Concern  . Not on file  Social History Narrative  . Not on file    Review of Systems: Gen: see HPI CV: Denies chest pain, heart  palpitations, peripheral edema, syncope.  Resp: Denies shortness of breath at rest or with exertion. Denies wheezing or cough.  GI: see HPI GU : Denies urinary burning, urinary frequency, urinary hesitancy MS: Denies joint pain, muscle weakness, cramps, or limitation of movement.  Derm: Denies rash, itching, dry skin Psych: Denies depression, anxiety, memory loss, and confusion Heme: see HPI  Physical Exam: BP 121/76   Pulse 84   Temp (!) 97.1 F (36.2 C) (Oral)   Ht 6' (1.829 m)   Wt 183 lb 6.4 oz (83.2 kg)   BMI 24.87 kg/m  General:   Alert and oriented. Pleasant and cooperative. Well-nourished and well-developed.  Head:  Normocephalic and atraumatic. Eyes:  Without icterus, sclera clear and conjunctiva pink.  Ears:  Normal auditory acuity. Lungs:  Clear to auscultation bilaterally. No wheezes, rales, or rhonchi. No distress.  Heart:  S1, S2 present without murmurs appreciated.  Abdomen:  +BS, soft, non-tender and non-distended. No HSM noted. No guarding or rebound. No masses appreciated.  Rectal: attempted rectal exam. No obvious fissure. Unable to complete DRE due to pain.  Msk:  Symmetrical without gross deformities.  Extremities:  Without edema. Neurologic:  Alert and  oriented x 4 Psych:  Alert and cooperative. Normal mood and affect.

## 2018-12-22 NOTE — Patient Instructions (Signed)
I have called in a cream to [place] that has medication in it to help heal any possible tear you may have. You will use this four times a day. Wear gloves while applying and wash hands thereafter.  Continue to avoid straining. Continue stool softeners. Limit toilet time to 2-3 minutes.  We are arranging a colonoscopy with Dr. Gala Romney in the near future!  It was a pleasure to see you today. I want to create trusting relationships with patients to provide genuine, compassionate, and quality care. I value your feedback. If you receive a survey regarding your visit,  I greatly appreciate you taking time to fill this out.   Annitta Needs, PhD, ANP-BC [facility]

## 2018-12-23 ENCOUNTER — Telehealth: Payer: Self-pay

## 2018-12-23 NOTE — Telephone Encounter (Signed)
Pt called office, requested for work note given yesterday to be faxed to his employer [employer]. Fax# [phone], attn: Maudie Mercury. Work note faxed per pt request.

## 2018-12-24 ENCOUNTER — Other Ambulatory Visit: Payer: Self-pay | Admitting: *Deleted

## 2018-12-24 ENCOUNTER — Telehealth: Payer: Self-pay | Admitting: *Deleted

## 2018-12-24 DIAGNOSIS — K625 Hemorrhage of anus and rectum: Secondary | ICD-10-CM

## 2018-12-24 MED ORDER — PEG 3350-KCL-NA BICARB-NACL 420 G PO SOLR: 4000.0000 mL | Freq: Once | ORAL | 0 refills | Status: AC

## 2018-12-24 NOTE — Telephone Encounter (Signed)
Called patient. He is scheduled for TCS with propofol 12/3 at 9:30am. Aware he will need pre-op/covid-19 appt. I will mail this to him with his prep instructions. Confirmed address. Rx sent to pharmacy. Orders entered.

## 2018-12-28 ENCOUNTER — Encounter: Payer: Self-pay | Admitting: *Deleted

## 2018-12-28 NOTE — Assessment & Plan Note (Signed)
53 year old male with new onset rectal bleeding, rectal discomfort and pain, clinically suspicious for fissure. Unable to complete DRE due to discomfort. No obvious fissure appreciated on exam. Bleeding has improved since onset in August, utilizing supportive measures and stool softeners. Noted weight loss from 195-200 range to now 183, which is concerning. No prior colonoscopy. Will treat empirically with [facility] cream compounded with nitro, and pursue colonoscopy in very near future.  Proceed with TCS with Dr. Gala Romney in near future: the risks, benefits, and alternatives have been discussed with the patient in detail. The patient states understanding and desires to proceed. [facility] cream called in with nitro Call if worsening or no improvement in interim

## 2019-03-03 NOTE — Patient Instructions (Signed)
Jordan Key  03/03/2019     @PREFPERIOPPHARMACY @   Your procedure is scheduled on  03/11/2019 .  Report to [facility] at  0800  A.M.  Call this number if you have problems the morning of surgery:  [phone]   Remember:  Follow the diet and prep instructions given to you by Dr Roseanne Kaufman office.                      Take these medicines the morning of surgery with A SIP OF WATER  Nexium, hydrocodone(if needed), tramadol(if needed).    Do not wear jewelry, make-up or nail polish.  Do not wear lotions, powders, or perfumes. Please wear deodorant and brush your teeth.  Do not shave 48 hours prior to surgery.  Men may shave face and neck.  Do not bring valuables to the hospital.  [facility] is not responsible for any belongings or valuables.  Contacts, dentures or bridgework may not be worn into surgery.  Leave your suitcase in the car.  After surgery it may be brought to your room.  For patients admitted to the hospital, discharge time will be determined by your treatment team.  Patients discharged the day of surgery will not be allowed to drive home.   Name and phone number of your driver:   family Special instructions:  None  Please read over the following fact sheets that you were given. Anesthesia Post-op Instructions and Care and Recovery After Surgery       Colonoscopy, Adult, Care After This sheet gives you information about how to care for yourself after your procedure. Your health care provider may also give you more specific instructions. If you have problems or questions, contact your health care provider. What can I expect after the procedure? After the procedure, it is common to have:  A small amount of blood in your stool for 24 hours after the procedure.  Some gas.  Mild abdominal cramping or bloating. Follow these instructions at home: General instructions  For the first 24 hours after the procedure: ? Do not drive or use machinery. ? Do  not sign important documents. ? Do not drink alcohol. ? Do your regular daily activities at a slower pace than normal. ? Eat soft, easy-to-digest foods.  Take over-the-counter or prescription medicines only as told by your health care provider. Relieving cramping and bloating   Try walking around when you have cramps or feel bloated.  Apply heat to your abdomen as told by your health care provider. Use a heat source that your health care provider recommends, such as a moist heat pack or a heating pad. ? Place a towel between your skin and the heat source. ? Leave the heat on for 20-30 minutes. ? Remove the heat if your skin turns bright red. This is especially important if you are unable to feel pain, heat, or cold. You may have a greater risk of getting burned. Eating and drinking   Drink enough fluid to keep your urine pale yellow.  Resume your normal diet as instructed by your health care provider. Avoid heavy or fried foods that are hard to digest.  Avoid drinking alcohol for as long as instructed by your health care provider. Contact a health care provider if:  You have blood in your stool 2-3 days after the procedure. Get help right away if:  You have more than a small spotting of blood in your stool.  You pass large blood clots in your stool.  Your abdomen is swollen.  You have nausea or vomiting.  You have a fever.  You have increasing abdominal pain that is not relieved with medicine. Summary  After the procedure, it is common to have a small amount of blood in your stool. You may also have mild abdominal cramping and bloating.  For the first 24 hours after the procedure, do not drive or use machinery, sign important documents, or drink alcohol.  Contact your health care provider if you have a lot of blood in your stool, nausea or vomiting, a fever, or increased abdominal pain. This information is not intended to replace advice given to you by your health care  provider. Make sure you discuss any questions you have with your health care provider. Document Released: 11/07/2003 Document Revised: 01/15/2017 Document Reviewed: 06/06/2015 Elsevier Patient Education  2020 [place] After These instructions provide you with information about caring for yourself after your procedure. Your health care provider may also give you more specific instructions. Your treatment has been planned according to current medical practices, but problems sometimes occur. Call your health care provider if you have any problems or questions after your procedure. What can I expect after the procedure? After your procedure, you may:  Feel sleepy for several hours.  Feel clumsy and have poor balance for several hours.  Feel forgetful about what happened after the procedure.  Have poor judgment for several hours.  Feel nauseous or vomit.  Have a sore throat if you had a breathing tube during the procedure. Follow these instructions at home: For at least 24 hours after the procedure:      Have a responsible adult stay with you. It is important to have someone help care for you until you are awake and alert.  Rest as needed.  Do not: ? Participate in activities in which you could fall or become injured. ? Drive. ? Use heavy machinery. ? Drink alcohol. ? Take sleeping pills or medicines that cause drowsiness. ? Make important decisions or sign legal documents. ? Take care of children on your own. Eating and drinking  Follow the diet that is recommended by your health care provider.  If you vomit, drink water, juice, or soup when you can drink without vomiting.  Make sure you have little or no nausea before eating solid foods. General instructions  Take over-the-counter and prescription medicines only as told by your health care provider.  If you have sleep apnea, surgery and certain medicines can increase your risk for  breathing problems. Follow instructions from your health care provider about wearing your sleep device: ? Anytime you are sleeping, including during daytime naps. ? While taking prescription pain medicines, sleeping medicines, or medicines that make you drowsy.  If you smoke, do not smoke without supervision.  Keep all follow-up visits as told by your health care provider. This is important. Contact a health care provider if:  You keep feeling nauseous or you keep vomiting.  You feel light-headed.  You develop a rash.  You have a fever. Get help right away if:  You have trouble breathing. Summary  For several hours after your procedure, you may feel sleepy and have poor judgment.  Have a responsible adult stay with you for at least 24 hours or until you are awake and alert. This information is not intended to replace advice given to you by your health care provider. Make sure you  discuss any questions you have with your health care provider. Document Released: 07/16/2015 Document Revised: 06/23/2017 Document Reviewed: 07/16/2015 Elsevier Patient Education  2020 [employer].

## 2019-03-09 ENCOUNTER — Other Ambulatory Visit: Payer: Self-pay

## 2019-03-09 ENCOUNTER — Encounter (HOSPITAL_COMMUNITY)
Admission: RE | Admit: 2019-03-09 | Discharge: 2019-03-09 | Disposition: A | Discharge status: Home / Self Care | Payer: Self-pay | Source: Ambulatory Visit | Attending: Internal Medicine | Admitting: Internal Medicine

## 2019-03-09 ENCOUNTER — Telehealth: Payer: Self-pay | Admitting: Internal Medicine

## 2019-03-09 ENCOUNTER — Other Ambulatory Visit (HOSPITAL_COMMUNITY)
Admission: RE | Admit: 2019-03-09 | Discharge: 2019-03-09 | Disposition: A | Discharge status: Home / Self Care | Payer: Self-pay | Source: Ambulatory Visit | Attending: Internal Medicine | Admitting: Internal Medicine

## 2019-03-09 NOTE — Telephone Encounter (Signed)
SHIRT STAY CALLED AND SAID THAT PATIENT CANCELLED HIS PROCEDURE

## 2019-03-09 NOTE — Telephone Encounter (Signed)
Called patient. He states he wants to r/s procedure. His new insurance will take affect in January. I have r/s'd him to 2/4 at 2:45pm. Patient aware will mail new prep instructions with pre-op/covid test. Confirmed address. Endo aware of new appt date/time

## 2019-04-26 ENCOUNTER — Telehealth: Payer: Self-pay | Admitting: *Deleted

## 2019-04-26 NOTE — Telephone Encounter (Signed)
LMOVM for pt 

## 2019-04-26 NOTE — Telephone Encounter (Signed)
Spoke with pt. His procedure time on 2/4 has been moved up to 11:45am. Patient aware will mail new prep instructions. His pre-op/covid testing appt will remain the same. Called endo and LMOVM making aware.

## 2019-05-07 ENCOUNTER — Telehealth: Payer: Self-pay | Admitting: Internal Medicine

## 2019-05-07 NOTE — Telephone Encounter (Signed)
PLEASE CALL PATIENT, HE NEEDS TO RESCHEDULE [phone]  [phone]

## 2019-05-07 NOTE — Telephone Encounter (Signed)
Called pt, states he can't make it to TCS next week. TCS rescheduled to 07/05/19 at 2:00pm. Endo scheduler informed. Pre-op and COVID test 07/02/19. Letter mailed with procedure instructions.

## 2019-05-11 ENCOUNTER — Encounter (HOSPITAL_COMMUNITY): Payer: Medicaid Other

## 2019-05-11 ENCOUNTER — Other Ambulatory Visit (HOSPITAL_COMMUNITY): Payer: Medicaid Other

## 2019-06-28 ENCOUNTER — Telehealth: Payer: Self-pay

## 2019-06-28 NOTE — Telephone Encounter (Signed)
[place] at pre-service center called office after hours and LMOVM. Pt needs PA for TCS.  Called [employer] [place] (last card on file), plan was cancelled 01/03/19.  Current [employer] ID listed in chart is for [employer] [place]. PA submitted via [employer] website. [employer] [facility] is out of network. Request status: in progress.

## 2019-06-28 NOTE — Patient Instructions (Signed)
Jordan Key  06/28/2019     @PREFPERIOPPHARMACY @   Your procedure is scheduled on  07/05/2019   Report to [facility] at  1230  P.M.  Call this number if you have problems the morning of surgery:  [phone]   Remember:  Follow the diet and prep instructions given to you by Dr [phone] office.                    Take these medicines the morning of surgery with A SIP OF WATER  Esomeprazole, hydrocodone or tramadol(if needed).    Do not wear jewelry, make-up or nail polish.  Do not wear lotions, powders, or perfumes. Please wear deodorant and brush your teeth.  Do not shave 48 hours prior to surgery.  Men may shave face and neck.  Do not bring valuables to the hospital.  [facility] is not responsible for any belongings or valuables.  Contacts, dentures or bridgework may not be worn into surgery.  Leave your suitcase in the car.  After surgery it may be brought to your room.  For patients admitted to the hospital, discharge time will be determined by your treatment team.  Patients discharged the day of surgery will not be allowed to drive home.   Name and phone number of your driver:   family Special instructions:  DO NOT smoke the morning of your procedure.  Please read over the following fact sheets that you were given. Anesthesia Post-op Instructions and Care and Recovery After Surgery       Colonoscopy, Adult, Care After This sheet gives you information about how to care for yourself after your procedure. Your health care provider may also give you more specific instructions. If you have problems or questions, contact your health care provider. What can I expect after the procedure? After the procedure, it is common to have:  A small amount of blood in your stool for 24 hours after the procedure.  Some gas.  Mild cramping or bloating of your abdomen. Follow these instructions at home: Eating and drinking   Drink enough fluid to keep your urine pale  yellow.  Follow instructions from your health care provider about eating or drinking restrictions.  Resume your normal diet as instructed by your health care provider. Avoid heavy or fried foods that are hard to digest. Activity  Rest as told by your health care provider.  Avoid sitting for a long time without moving. Get up to take short walks every 1-2 hours. This is important to improve blood flow and breathing. Ask for help if you feel weak or unsteady.  Return to your normal activities as told by your health care provider. Ask your health care provider what activities are safe for you. Managing cramping and bloating   Try walking around when you have cramps or feel bloated.  Apply heat to your abdomen as told by your health care provider. Use the heat source that your health care provider recommends, such as a moist heat pack or a heating pad. ? Place a towel between your skin and the heat source. ? Leave the heat on for 20-30 minutes. ? Remove the heat if your skin turns bright red. This is especially important if you are unable to feel pain, heat, or cold. You may have a greater risk of getting burned. General instructions  For the first 24 hours after the procedure: ? Do not drive or use machinery. ? Do  not sign important documents. ? Do not drink alcohol. ? Do your regular daily activities at a slower pace than normal. ? Eat soft foods that are easy to digest.  Take over-the-counter and prescription medicines only as told by your health care provider.  Keep all follow-up visits as told by your health care provider. This is important. Contact a health care provider if:  You have blood in your stool 2-3 days after the procedure. Get help right away if you have:  More than a small spotting of blood in your stool.  Large blood clots in your stool.  Swelling of your abdomen.  Nausea or vomiting.  A fever.  Increasing pain in your abdomen that is not relieved with  medicine. Summary  After the procedure, it is common to have a small amount of blood in your stool. You may also have mild cramping and bloating of your abdomen.  For the first 24 hours after the procedure, do not drive or use machinery, sign important documents, or drink alcohol.  Get help right away if you have a lot of blood in your stool, nausea or vomiting, a fever, or increased pain in your abdomen. This information is not intended to replace advice given to you by your health care provider. Make sure you discuss any questions you have with your health care provider. Document Revised: 10/19/2018 Document Reviewed: 10/19/2018 Elsevier Patient Education  [place] After These instructions provide you with information about caring for yourself after your procedure. Your health care provider may also give you more specific instructions. Your treatment has been planned according to current medical practices, but problems sometimes occur. Call your health care provider if you have any problems or questions after your procedure. What can I expect after the procedure? After your procedure, you may:  Feel sleepy for several hours.  Feel clumsy and have poor balance for several hours.  Feel forgetful about what happened after the procedure.  Have poor judgment for several hours.  Feel nauseous or vomit.  Have a sore throat if you had a breathing tube during the procedure. Follow these instructions at home: For at least 24 hours after the procedure:      Have a responsible adult stay with you. It is important to have someone help care for you until you are awake and alert.  Rest as needed.  Do not: ? Participate in activities in which you could fall or become injured. ? Drive. ? Use heavy machinery. ? Drink alcohol. ? Take sleeping pills or medicines that cause drowsiness. ? Make important decisions or sign legal documents. ? Take care  of children on your own. Eating and drinking  Follow the diet that is recommended by your health care provider.  If you vomit, drink water, juice, or soup when you can drink without vomiting.  Make sure you have little or no nausea before eating solid foods. General instructions  Take over-the-counter and prescription medicines only as told by your health care provider.  If you have sleep apnea, surgery and certain medicines can increase your risk for breathing problems. Follow instructions from your health care provider about wearing your sleep device: ? Anytime you are sleeping, including during daytime naps. ? While taking prescription pain medicines, sleeping medicines, or medicines that make you drowsy.  If you smoke, do not smoke without supervision.  Keep all follow-up visits as told by your health care provider. This is important. Contact a health care  provider if:  You keep feeling nauseous or you keep vomiting.  You feel light-headed.  You develop a rash.  You have a fever. Get help right away if:  You have trouble breathing. Summary  For several hours after your procedure, you may feel sleepy and have poor judgment.  Have a responsible adult stay with you for at least 24 hours or until you are awake and alert. This information is not intended to replace advice given to you by your health care provider. Make sure you discuss any questions you have with your health care provider. Document Revised: 06/23/2017 Document Reviewed: 07/16/2015 Elsevier Patient Education  [place].

## 2019-06-29 ENCOUNTER — Telehealth: Payer: Self-pay

## 2019-06-29 NOTE — Telephone Encounter (Signed)
Pt's spouse called to ask if pts TCS could be coded as preventative. Spouse is concerned about coverage of procedure.pts procedure of 3.29.2021

## 2019-06-30 ENCOUNTER — Other Ambulatory Visit: Payer: Self-pay

## 2019-06-30 NOTE — Telephone Encounter (Signed)
It cannot since it is being done for rectal bleeding.

## 2019-06-30 NOTE — Telephone Encounter (Signed)
Called [facility], spoke to [place], she updated facility on case. TCS approved. PA# [id number], valid 06/28/18-08/26/19. [place] at pre-service center informed.

## 2019-07-01 ENCOUNTER — Encounter (HOSPITAL_COMMUNITY)
Admission: RE | Admit: 2019-07-01 | Discharge: 2019-07-01 | Disposition: A | Discharge status: Home / Self Care | Payer: BLUE CROSS/BLUE SHIELD | Source: Ambulatory Visit | Attending: Internal Medicine | Admitting: Internal Medicine

## 2019-07-01 ENCOUNTER — Other Ambulatory Visit (HOSPITAL_COMMUNITY)
Admission: RE | Admit: 2019-07-01 | Discharge: 2019-07-01 | Disposition: A | Discharge status: Home / Self Care | Payer: BLUE CROSS/BLUE SHIELD | Source: Ambulatory Visit | Attending: Internal Medicine | Admitting: Internal Medicine

## 2019-07-01 ENCOUNTER — Encounter (HOSPITAL_COMMUNITY): Payer: Self-pay

## 2019-07-01 ENCOUNTER — Other Ambulatory Visit: Payer: Self-pay

## 2019-07-01 DIAGNOSIS — K625 Hemorrhage of anus and rectum: Secondary | ICD-10-CM | POA: Insufficient documentation

## 2019-07-01 DIAGNOSIS — Z01812 Encounter for preprocedural laboratory examination: Secondary | ICD-10-CM | POA: Insufficient documentation

## 2019-07-01 DIAGNOSIS — Z20822 Contact with and (suspected) exposure to covid-19: Secondary | ICD-10-CM | POA: Insufficient documentation

## 2019-07-01 HISTORY — DX: Personal history of urinary calculi: Z87.442

## 2019-07-01 LAB — COMPREHENSIVE METABOLIC PANEL
ALT: 11 U/L (ref 0–44)
AST: 16 U/L (ref 15–41)
Albumin: 4 g/dL (ref 3.5–5.0)
Alkaline Phosphatase: 93 U/L (ref 38–126)
Anion gap: 10 (ref 5–15)
BUN: 7 mg/dL (ref 6–20)
CO2: 26 mmol/L (ref 22–32)
Calcium: 9.2 mg/dL (ref 8.9–10.3)
Chloride: 102 mmol/L (ref 98–111)
Creatinine, Ser: 1.06 mg/dL (ref 0.61–1.24)
GFR calc Af Amer: >60 mL/min (ref >60)
GFR calc non Af Amer: >60 mL/min (ref >60)
Glucose, Bld: 81 mg/dL (ref 70–99)
Potassium: 3.6 mmol/L (ref 3.5–5.1)
Sodium: 138 mmol/L (ref 135–145)
Total Bilirubin: 0.5 mg/dL (ref 0.3–1.2)
Total Protein: 7.3 g/dL (ref 6.5–8.1)

## 2019-07-01 LAB — CBC WITH DIFFERENTIAL/PLATELET
Abs Immature Granulocytes: 0.03 10*3/uL (ref 0.00–0.07)
Basophils Absolute: 0.1 10*3/uL (ref 0.0–0.1)
Basophils Relative: 1 %
Eosinophils Absolute: 0 10*3/uL (ref 0.0–0.5)
Eosinophils Relative: 0 %
HCT: 37.2 % — ABNORMAL LOW (ref 39.0–52.0)
Hemoglobin: 12.2 g/dL — ABNORMAL LOW (ref 13.0–17.0)
Immature Granulocytes: 0 %
Lymphocytes Relative: 21 %
Lymphs Abs: 1.9 10*3/uL (ref 0.7–4.0)
MCH: 29.9 pg (ref 26.0–34.0)
MCHC: 32.8 g/dL (ref 30.0–36.0)
MCV: 91.2 fL (ref 80.0–100.0)
Monocytes Absolute: 0.6 10*3/uL (ref 0.1–1.0)
Monocytes Relative: 7 %
Neutro Abs: 6.3 10*3/uL (ref 1.7–7.7)
Neutrophils Relative %: 71 %
Platelets: 239 10*3/uL (ref 150–400)
RBC: 4.08 MIL/uL — ABNORMAL LOW (ref 4.22–5.81)
RDW: 13.5 % (ref 11.5–15.5)
WBC: 8.9 10*3/uL (ref 4.0–10.5)
nRBC: 0 % (ref 0.0–0.2)

## 2019-07-01 NOTE — Telephone Encounter (Signed)
Noted. Pts spouse is aware.  

## 2019-07-01 NOTE — Telephone Encounter (Signed)
Reviewed

## 2019-07-02 ENCOUNTER — Other Ambulatory Visit (HOSPITAL_COMMUNITY): Payer: Medicaid Other

## 2019-07-02 ENCOUNTER — Telehealth: Payer: Self-pay | Admitting: Internal Medicine

## 2019-07-02 ENCOUNTER — Encounter (HOSPITAL_COMMUNITY): Payer: Medicaid Other

## 2019-07-02 LAB — SARS CORONAVIRUS 2 (TAT 6-24 HRS): SARS Coronavirus 2: NEGATIVE

## 2019-07-02 NOTE — Telephone Encounter (Signed)
Called the pharmacy. They will have the pharmacist call back.

## 2019-07-02 NOTE — Telephone Encounter (Signed)
Called [facility]. Suprep is $92. They will give pt Miralax instructions and pull items he will need. Miralax instructions faxed to pharmacy.  Called and informed pt.

## 2019-07-02 NOTE — Telephone Encounter (Signed)
Matt from [facility] called to say that the prescription for the prep that was called in back in Sept 2020 is now on back order and the patient's procedure is Monday with RMR. Please advise. [phone]

## 2019-07-02 NOTE — Telephone Encounter (Signed)
Please call [facility], They have a question about a medication .[phone]

## 2019-07-05 ENCOUNTER — Ambulatory Visit (HOSPITAL_COMMUNITY): Payer: BLUE CROSS/BLUE SHIELD | Admitting: Anesthesiology

## 2019-07-05 ENCOUNTER — Encounter (HOSPITAL_COMMUNITY): Payer: Self-pay | Admitting: Internal Medicine

## 2019-07-05 ENCOUNTER — Encounter (HOSPITAL_COMMUNITY)
Admission: RE | Disposition: A | Discharge status: Home / Self Care | Payer: Self-pay | Source: Home / Self Care | Attending: Internal Medicine

## 2019-07-05 ENCOUNTER — Ambulatory Visit (HOSPITAL_COMMUNITY)
Admission: RE | Admit: 2019-07-05 | Discharge: 2019-07-05 | Disposition: A | Discharge status: Home / Self Care | Payer: BLUE CROSS/BLUE SHIELD | Attending: Internal Medicine | Admitting: Internal Medicine

## 2019-07-05 DIAGNOSIS — Z880 Allergy status to penicillin: Secondary | ICD-10-CM | POA: Insufficient documentation

## 2019-07-05 DIAGNOSIS — K921 Melena: Secondary | ICD-10-CM | POA: Diagnosis present

## 2019-07-05 DIAGNOSIS — Z79899 Other long term (current) drug therapy: Secondary | ICD-10-CM | POA: Diagnosis not present

## 2019-07-05 DIAGNOSIS — F17210 Nicotine dependence, cigarettes, uncomplicated: Secondary | ICD-10-CM | POA: Diagnosis not present

## 2019-07-05 DIAGNOSIS — K5730 Diverticulosis of large intestine without perforation or abscess without bleeding: Secondary | ICD-10-CM | POA: Insufficient documentation

## 2019-07-05 DIAGNOSIS — Z888 Allergy status to other drugs, medicaments and biological substances status: Secondary | ICD-10-CM | POA: Diagnosis not present

## 2019-07-05 DIAGNOSIS — K640 First degree hemorrhoids: Secondary | ICD-10-CM | POA: Diagnosis not present

## 2019-07-05 DIAGNOSIS — Z885 Allergy status to narcotic agent status: Secondary | ICD-10-CM | POA: Insufficient documentation

## 2019-07-05 DIAGNOSIS — K219 Gastro-esophageal reflux disease without esophagitis: Secondary | ICD-10-CM | POA: Insufficient documentation

## 2019-07-05 DIAGNOSIS — K625 Hemorrhage of anus and rectum: Secondary | ICD-10-CM

## 2019-07-05 DIAGNOSIS — Z87892 Personal history of anaphylaxis: Secondary | ICD-10-CM | POA: Insufficient documentation

## 2019-07-05 HISTORY — PX: COLONOSCOPY WITH PROPOFOL: SHX5780

## 2019-07-05 SURGERY — COLONOSCOPY WITH PROPOFOL
Anesthesia: General

## 2019-07-05 MED ORDER — CHLORHEXIDINE GLUCONATE CLOTH 2 % EX PADS: 6.0000 | MEDICATED_PAD | Freq: Once | CUTANEOUS | Status: DC

## 2019-07-05 MED ORDER — PROPOFOL 10 MG/ML IV BOLUS
INTRAVENOUS | Status: DC | PRN
  Administered 2019-07-05: 100 ug/kg/min via INTRAVENOUS

## 2019-07-05 MED ORDER — LACTATED RINGERS IV SOLN
Freq: Once | INTRAVENOUS | Status: AC
  Administered 2019-07-05: 13:00:00 1000 mL via INTRAVENOUS

## 2019-07-05 MED ORDER — LACTATED RINGERS IV SOLN: INTRAVENOUS | Status: DC | PRN

## 2019-07-05 MED ORDER — PROPOFOL 10 MG/ML IV BOLUS
INTRAVENOUS | Status: AC
  Filled 2019-07-05: qty 40

## 2019-07-05 NOTE — Anesthesia Preprocedure Evaluation (Addendum)
Anesthesia Evaluation  Patient identified by MRN, date of birth, ID band Patient awake    Reviewed: Allergy & Precautions, NPO status , Patient's Chart, lab work & pertinent test results  History of Anesthesia Complications Negative for: history of anesthetic complications  Airway Mallampati: II  TM Distance: >3 FB Neck ROM: Full    Dental  (+) Missing, Dental Advisory Given, Edentulous Upper   Pulmonary Current SmokerPatient did not abstain from smoking.,    Pulmonary exam normal breath sounds clear to auscultation       Cardiovascular Exercise Tolerance: Good Normal cardiovascular exam Rhythm:Regular Rate:Normal     Neuro/Psych negative neurological ROS  negative psych ROS   GI/Hepatic Neg liver ROS, Bowel prep,GERD  Medicated and Controlled,  Endo/Other  negative endocrine ROS  Renal/GU negative Renal ROS     Musculoskeletal  (+) Arthritis , Left hip pain   Abdominal   Peds  Hematology negative hematology ROS (+)   Anesthesia Other Findings   Reproductive/Obstetrics negative OB ROS                           Anesthesia Physical Anesthesia Plan  ASA: II  Anesthesia Plan: General   Post-op Pain Management:    Induction: Intravenous  PONV Risk Score and Plan: 1 and Treatment may vary due to age or medical condition  Airway Management Planned: Nasal Cannula, Natural Airway and Simple Face Mask  Additional Equipment:   Intra-op Plan:   Post-operative Plan:   Informed Consent: I have reviewed the patients History and Physical, chart, labs and discussed the procedure including the risks, benefits and alternatives for the proposed anesthesia with the patient or authorized representative who has indicated his/her understanding and acceptance.     Dental advisory given  Plan Discussed with: CRNA and Surgeon  Anesthesia Plan Comments:         Anesthesia Quick  Evaluation

## 2019-07-05 NOTE — Transfer of Care (Signed)
Immediate Anesthesia Transfer of Care Note  Patient: Jordan Key  Procedure(s) Performed: COLONOSCOPY WITH PROPOFOL (N/A )  Patient Location: PACU  Anesthesia Type:MAC  Level of Consciousness: awake, alert , oriented and patient cooperative  Airway & Oxygen Therapy: Patient Spontanous Breathing and Patient connected to nasal cannula oxygen  Post-op Assessment: Report given to RN, Post -op Vital signs reviewed and stable and Patient moving all extremities X 4  Post vital signs: Reviewed and stable  Last Vitals:  Vitals Value Taken Time  BP 109/55 07/05/19 1507  Temp    Pulse 64 07/05/19 1508  Resp 22 07/05/19 1508  SpO2 100 % 07/05/19 1508  Vitals shown include unvalidated device data.  Last Pain:  Vitals:   07/05/19 1253  TempSrc: Oral  PainSc: 2       Patients Stated Pain Goal: 7 (91/50/41 [id number])  Complications: No apparent anesthesia complications

## 2019-07-05 NOTE — Anesthesia Postprocedure Evaluation (Signed)
Anesthesia Post Note  Patient: Jordan Key  Procedure(s) Performed: COLONOSCOPY WITH PROPOFOL (N/A )  Patient location during evaluation: PACU Anesthesia Type: General Level of consciousness: awake and alert Pain management: pain level controlled Vital Signs Assessment: post-procedure vital signs reviewed and stable Respiratory status: spontaneous breathing, nonlabored ventilation, respiratory function stable and patient connected to nasal cannula oxygen Cardiovascular status: stable and blood pressure returned to baseline Postop Assessment: no apparent nausea or vomiting Anesthetic complications: no     Last Vitals:  Vitals:   07/05/19 1253 07/05/19 1507  BP: (!) 161/88 (!) 109/55  Pulse: 82 64  Resp: 18 20  Temp: 37.1 C (P) 36.7 C  SpO2: 100%     Last Pain:  Vitals:   07/05/19 1253  TempSrc: Oral  PainSc: 2                  Malikye Reppond

## 2019-07-05 NOTE — Op Note (Signed)
[facility] Patient Name: Yash Cacciola Procedure Date: 07/05/2019 2:24 PM MRN: [id number] Date of Birth: Aug 16, 1965 Attending MD: Gennette Pac , MD CSN: [id number] Age: 54 Admit Type: Outpatient Procedure:                Colonoscopy Indications:              Hematochezia Providers:                Gennette Pac, MD, Jannett Celestine, RN, Pandora Leiter, Technician Referring MD:              Medicines:                Propofol per Anesthesia Complications:            No immediate complications. Estimated Blood Loss:     Estimated blood loss: none. Procedure:                Pre-Anesthesia Assessment:                           - Prior to the procedure, a History and Physical                            was performed, and patient medications and                            allergies were reviewed. The patient's tolerance of                            previous anesthesia was also reviewed. The risks                            and benefits of the procedure and the sedation                            options and risks were discussed with the patient.                            All questions were answered, and informed consent                            was obtained. Prior Anticoagulants: The patient has                            taken no previous anticoagulant or antiplatelet                            agents. ASA Grade Assessment: III - A patient with                            severe systemic disease. After reviewing the risks                            and  benefits, the patient was deemed in                            satisfactory condition to undergo the procedure.                           After obtaining informed consent, the colonoscope                            was passed under direct vision. Throughout the                            procedure, the patient's blood pressure, pulse, and                            oxygen saturations were monitored  continuously. The                            [id number]-[id number] ([id number]) scope was introduced through                            the anus and advanced to the the cecum, identified                            by appendiceal orifice and ileocecal valve. The                            colonoscopy was performed without difficulty. The                            patient tolerated the procedure well. The quality                            of the bowel preparation was adequate. Scope In: 2:46:25 PM Scope Out: 3:03:16 PM Scope Withdrawal Time: 0 hours 11 minutes 25 seconds  Total Procedure Duration: 0 hours 16 minutes 51 seconds  Findings:      The perianal and digital rectal examinations were normal.      Scattered medium-mouthed diverticula were found in the sigmoid colon and       descending colon.      Non-bleeding internal hemorrhoids were found during retroflexion. The       hemorrhoids were mild, small and Grade I (internal hemorrhoids that do       not prolapse).      The exam was otherwise without abnormality on direct and retroflexion       views. Impression:               - Diverticulosis in the sigmoid colon and in the                            descending colon.                           - Non-bleeding internal hemorrhoids.                           -  The examination was otherwise normal on direct                            and retroflexion views.                           - No specimens collected. Moderate Sedation:      Moderate (conscious) sedation was personally administered by an       anesthesia professional. The following parameters were monitored: oxygen       saturation, heart rate, blood pressure, respiratory rate, EKG, adequacy       of pulmonary ventilation, and response to care. Recommendation:           - Patient has a contact number available for                            emergencies. The signs and symptoms of potential                            delayed complications  were discussed with the                            patient. Return to normal activities tomorrow.                            Written discharge instructions were provided to the                            patient.                           - Resume previous diet.                           - Continue present medications.                           - Repeat colonoscopy in 10 years for screening                            purposes.                           - Return to GI office in 3 months. Procedure Code(s):        --- Professional ---                           [phone], Colonoscopy, flexible; diagnostic, including                            collection of specimen(s) by brushing or washing,                            when performed (separate procedure) Diagnosis Code(s):        --- Professional ---  K64.0, First degree hemorrhoids                           K92.1, Melena (includes Hematochezia)                           K57.30, Diverticulosis of large intestine without                            perforation or abscess without bleeding CPT copyright 2019 American Medical Association. All rights reserved. The codes documented in this report are preliminary and upon coder review may  be revised to meet current compliance requirements. Gerrit Friends. Lissy Deuser, MD Gennette Pac, MD 07/05/2019 3:14:18 PM This report has been signed electronically. Number of Addenda: 0

## 2019-07-05 NOTE — Discharge Instructions (Signed)
Colonoscopy Discharge Instructions  Read the instructions outlined below and refer to this sheet in the next few weeks. These discharge instructions provide you with general information on caring for yourself after you leave the hospital. Your doctor may also give you specific instructions. While your treatment has been planned according to the most current medical practices available, unavoidable complications occasionally occur. If you have any problems or questions after discharge, call Dr. Jena Gauss at [phone]. ACTIVITY  You may resume your regular activity, but move at a slower pace for the next 24 hours.   Take frequent rest periods for the next 24 hours.   Walking will help get rid of the air and reduce the bloated feeling in your belly (abdomen).   No driving for 24 hours (because of the medicine (anesthesia) used during the test).    Do not sign any important legal documents or operate any machinery for 24 hours (because of the anesthesia used during the test).  NUTRITION  Drink plenty of fluids.   You may resume your normal diet as instructed by your doctor.   Begin with a light meal and progress to your normal diet. Heavy or fried foods are harder to digest and may make you feel sick to your stomach (nauseated).   Avoid alcoholic beverages for 24 hours or as instructed.  MEDICATIONS  You may resume your normal medications unless your doctor tells you otherwise.  WHAT YOU CAN EXPECT TODAY  Some feelings of bloating in the abdomen.   Passage of more gas than usual.   Spotting of blood in your stool or on the toilet paper.  IF YOU HAD POLYPS REMOVED DURING THE COLONOSCOPY:  No aspirin products for 7 days or as instructed.   No alcohol for 7 days or as instructed.   Eat a soft diet for the next 24 hours.  FINDING OUT THE RESULTS OF YOUR TEST Not all test results are available during your visit. If your test results are not back during the visit, make an appointment  with your caregiver to find out the results. Do not assume everything is normal if you have not heard from your caregiver or the medical facility. It is important for you to follow up on all of your test results.  SEEK IMMEDIATE MEDICAL ATTENTION IF:  You have more than a spotting of blood in your stool.   Your belly is swollen (abdominal distention).   You are nauseated or vomiting.   You have a temperature over 101.   You have abdominal pain or discomfort that is severe or gets worse throughout the day.    Diverticulosis and hemorrhoid information provided  Repeat colonoscopy in 10 years for screening purposes  Office visit with [place] in 3 months.  Patient request, I called Tresa Endo at [phone] and reviewed results   Monitored Anesthesia Care Anesthesia is a term that refers to techniques, procedures, and medicines that help a person stay safe and comfortable during a medical procedure. Monitored anesthesia care, or sedation, is one type of anesthesia. Your anesthesia specialist may recommend sedation if you will be having a procedure that does not require you to be unconscious, such as:  Cataract surgery.  A dental procedure.  A biopsy.  A colonoscopy. During the procedure, you may receive a medicine to help you relax (sedative). There are three levels of sedation:  Mild sedation. At this level, you may feel awake and relaxed. You will be able to follow directions.  Moderate sedation. At this  level, you will be sleepy. You may not remember the procedure.  Deep sedation. At this level, you will be asleep. You will not remember the procedure. The more medicine you are given, the deeper your level of sedation will be. Depending on how you respond to the procedure, the anesthesia specialist may change your level of sedation or the type of anesthesia to fit your needs. An anesthesia specialist will monitor you closely during the procedure. Let your health care provider know  about:  Any allergies you have.  All medicines you are taking, including vitamins, herbs, eye drops, creams, and over-the-counter medicines.  Any use of steroids (by mouth or as a cream).  Any problems you or family members have had with sedatives and anesthetic medicines.  Any blood disorders you have.  Any surgeries you have had.  Any medical conditions you have, such as sleep apnea.  Whether you are pregnant or may be pregnant.  Any use of cigarettes, alcohol, or street drugs. What are the risks? Generally, this is a safe procedure. However, problems may occur, including:  Getting too much medicine (oversedation).  Nausea.  Allergic reaction to medicines.  Trouble breathing. If this happens, a breathing tube may be used to help with breathing. It will be removed when you are awake and breathing on your own.  Heart trouble.  Lung trouble. Before the procedure Staying hydrated Follow instructions from your health care provider about hydration, which may include:  Up to 2 hours before the procedure - you may continue to drink clear liquids, such as water, clear fruit juice, black coffee, and plain tea. Eating and drinking restrictions Follow instructions from your health care provider about eating and drinking, which may include:  8 hours before the procedure - stop eating heavy meals or foods such as meat, fried foods, or fatty foods.  6 hours before the procedure - stop eating light meals or foods, such as toast or cereal.  6 hours before the procedure - stop drinking milk or drinks that contain milk.  2 hours before the procedure - stop drinking clear liquids. Medicines Ask your health care provider about:  Changing or stopping your regular medicines. This is especially important if you are taking diabetes medicines or blood thinners.  Taking medicines such as aspirin and ibuprofen. These medicines can thin your blood. Do not take these medicines before your  procedure if your health care provider instructs you not to. Tests and exams  You will have a physical exam.  You may have blood tests done to show: ? How well your kidneys and liver are working. ? How well your blood can clot. General instructions  Plan to have someone take you home from the hospital or clinic.  If you will be going home right after the procedure, plan to have someone with you for 24 hours.  What happens during the procedure?  Your blood pressure, heart rate, breathing, level of pain and overall condition will be monitored.  An IV tube will be inserted into one of your veins.  Your anesthesia specialist will give you medicines as needed to keep you comfortable during the procedure. This may mean changing the level of sedation.  The procedure will be performed. After the procedure  Your blood pressure, heart rate, breathing rate, and blood oxygen level will be monitored until the medicines you were given have worn off.  Do not drive for 24 hours if you received a sedative.  You may: ? Feel sleepy, clumsy,  or nauseous. ? Feel forgetful about what happened after the procedure. ? Have a sore throat if you had a breathing tube during the procedure. ? Vomit. This information is not intended to replace advice given to you by your health care provider. Make sure you discuss any questions you have with your health care provider. Document Revised: 03/07/2017 Document Reviewed: 07/16/2015 Elsevier Patient Education  2020 [employer].    Diverticulosis  Diverticulosis is a condition that develops when small pouches (diverticula) form in the wall of the large intestine (colon). The colon is where water is absorbed and stool (feces) is formed. The pouches form when the inside layer of the colon pushes through weak spots in the outer layers of the colon. You may have a few pouches or many of them. The pouches usually do not cause problems unless they become inflamed or  infected. When this happens, the condition is called diverticulitis. What are the causes? The cause of this condition is not known. What increases the risk? The following factors may make you more likely to develop this condition:  Being older than age 37. Your risk for this condition increases with age. Diverticulosis is rare among people younger than age 10. By age 65, many people have it.  Eating a low-fiber diet.  Having frequent constipation.  Being overweight.  Not getting enough exercise.  Smoking.  Taking over-the-counter pain medicines, like aspirin and ibuprofen.  Having a family history of diverticulosis. What are the signs or symptoms? In most people, there are no symptoms of this condition. If you do have symptoms, they may include:  Bloating.  Cramps in the abdomen.  Constipation or diarrhea.  Pain in the lower left side of the abdomen. How is this diagnosed? Because diverticulosis usually has no symptoms, it is most often diagnosed during an exam for other colon problems. The condition may be diagnosed by:  Using a flexible scope to examine the colon (colonoscopy).  Taking an X-ray of the colon after dye has been put into the colon (barium enema).  Having a CT scan. How is this treated? You may not need treatment for this condition. Your health care provider may recommend treatment to prevent problems. You may need treatment if you have symptoms or if you previously had diverticulitis. Treatment may include:  Eating a high-fiber diet.  Taking a fiber supplement.  Taking a live bacteria supplement (probiotic).  Taking medicine to relax your colon. Follow these instructions at home: Medicines  Take over-the-counter and prescription medicines only as told by your health care provider.  If told by your health care provider, take a fiber supplement or probiotic. Constipation prevention Your condition may cause constipation. To prevent or treat  constipation, you may need to:  Drink enough fluid to keep your urine pale yellow.  Take over-the-counter or prescription medicines.  Eat foods that are high in fiber, such as beans, whole grains, and fresh fruits and vegetables.  Limit foods that are high in fat and processed sugars, such as fried or sweet foods.  General instructions  Try not to strain when you have a bowel movement.  Keep all follow-up visits as told by your health care provider. This is important. Contact a health care provider if you:  Have pain in your abdomen.  Have bloating.  Have cramps.  Have not had a bowel movement in 3 days. Get help right away if:  Your pain gets worse.  Your bloating becomes very bad.  You have a fever  or chills, and your symptoms suddenly get worse.  You vomit.  You have bowel movements that are bloody or black.  You have bleeding from your rectum. Summary  Diverticulosis is a condition that develops when small pouches (diverticula) form in the wall of the large intestine (colon).  You may have a few pouches or many of them.  This condition is most often diagnosed during an exam for other colon problems.  Treatment may include increasing the fiber in your diet, taking supplements, or taking medicines. This information is not intended to replace advice given to you by your health care provider. Make sure you discuss any questions you have with your health care provider. Document Revised: 10/22/2018 Document Reviewed: 10/22/2018 Elsevier Patient Education  [place].   Hemorrhoids Hemorrhoids are swollen veins that may develop:  In the butt (rectum). These are called internal hemorrhoids.  Around the opening of the butt (anus). These are called external hemorrhoids. Hemorrhoids can cause pain, itching, or bleeding. Most of the time, they do not cause serious problems. They usually get better with diet changes, lifestyle changes, and other home  treatments. What are the causes? This condition may be caused by:  Having trouble pooping (constipation).  Pushing hard (straining) to poop.  Watery poop (diarrhea).  Pregnancy.  Being very overweight (obese).  Sitting for long periods of time.  Heavy lifting or other activity that causes you to strain.  Anal sex.  Riding a bike for a long period of time. What are the signs or symptoms? Symptoms of this condition include:  Pain.  Itching or soreness in the butt.  Bleeding from the butt.  Leaking poop.  Swelling in the area.  One or more lumps around the opening of your butt. How is this diagnosed? A doctor can often diagnose this condition by looking at the affected area. The doctor may also:  Do an exam that involves feeling the area with a gloved hand (digital rectal exam).  Examine the area inside your butt using a small tube (anoscope).  Order blood tests. This may be done if you have lost a lot of blood.  Have you get a test that involves looking inside the colon using a flexible tube with a camera on the end (sigmoidoscopy or colonoscopy). How is this treated? This condition can usually be treated at home. Your doctor may tell you to change what you eat, make lifestyle changes, or try home treatments. If these do not help, procedures can be done to remove the hemorrhoids or make them smaller. These may involve:  Placing rubber bands at the base of the hemorrhoids to cut off their blood supply.  Injecting medicine into the hemorrhoids to shrink them.  Shining a type of light energy onto the hemorrhoids to cause them to fall off.  Doing surgery to remove the hemorrhoids or cut off their blood supply. Follow these instructions at home: Eating and drinking   Eat foods that have a lot of fiber in them. These include whole grains, beans, nuts, fruits, and vegetables.  Ask your doctor about taking products that have added fiber (fibersupplements).  Reduce  the amount of fat in your diet. You can do this by: ? Eating low-fat dairy products. ? Eating less red meat. ? Avoiding processed foods.  Drink enough fluid to keep your pee (urine) pale yellow. Managing pain and swelling   Take a warm-water bath (sitz bath) for 20 minutes to ease pain. Do this 3-4 times a day.  You may do this in a bathtub or using a portable sitz bath that fits over the toilet.  If told, put ice on the painful area. It may be helpful to use ice between your warm baths. ? Put ice in a plastic bag. ? Place a towel between your skin and the bag. ? Leave the ice on for 20 minutes, 2-3 times a day. General instructions  Take over-the-counter and prescription medicines only as told by your doctor. ? Medicated creams and medicines may be used as told.  Exercise often. Ask your doctor how much and what kind of exercise is best for you.  Go to the bathroom when you have the urge to poop. Do not wait.  Avoid pushing too hard when you poop.  Keep your butt dry and clean. Use wet toilet paper or moist towelettes after pooping.  Do not sit on the toilet for a long time.  Keep all follow-up visits as told by your doctor. This is important. Contact a doctor if you:  Have pain and swelling that do not get better with treatment or medicine.  Have trouble pooping.  Cannot poop.  Have pain or swelling outside the area of the hemorrhoids. Get help right away if you have:  Bleeding that will not stop. Summary  Hemorrhoids are swollen veins in the butt or around the opening of the butt.  They can cause pain, itching, or bleeding.  Eat foods that have a lot of fiber in them. These include whole grains, beans, nuts, fruits, and vegetables.  Take a warm-water bath (sitz bath) for 20 minutes to ease pain. Do this 3-4 times a day. This information is not intended to replace advice given to you by your health care provider. Make sure you discuss any questions you have with  your health care provider. Document Revised: 04/02/2018 Document Reviewed: 08/14/2017 Elsevier Patient Education  2020 [employer].

## 2019-07-05 NOTE — H&P (Signed)
@LOGO @   Primary Care Physician:  Redmond School, MD Primary Gastroenterologist:  Dr. Gala Romney  Pre-Procedure History & Physical: HPI:  Jordan Key is a 54 y.o. male here for diagnostic colonoscopy chronic intermittent rectal bleeding.  Had severe anal pain last fall.  Took [employer] compounded hemorrhoid cream with nitroglycerin.  Patient states this was associated with resolution of his pain.  Continues to have intermittent rectal bleeding.  Continue continues to deny diarrhea or constipation.  No prior colonoscopy. Pandemic and associated lockdown has delayed his evaluation.  Past Medical History:  Diagnosis Date  . GERD (gastroesophageal reflux disease)   . History of kidney stones   . Hx of blood clots   . Hyperlipidemia     Past Surgical History:  Procedure Laterality Date  . ARTERIAL THROMBECTOMY  2006  . CHOLECYSTECTOMY      Prior to Admission medications   Medication Sig Start Date End Date Taking? Authorizing Provider  Docusate Sodium (STOOL SOFTENER) 100 MG capsule Take 100-200 mg by mouth 2 (two) times daily as needed for constipation.    Yes [provider]  esomeprazole (NEXIUM) 20 MG capsule Take 20 mg by mouth in the morning and at bedtime.    Yes [provider]  ibuprofen (ADVIL) 200 MG tablet Take 400 mg by mouth every 8 (eight) hours as needed (pain.).   Yes [provider]  ondansetron (ZOFRAN) 4 MG tablet Take 4 mg by mouth 4 (four) times daily as needed for nausea or vomiting.  06/23/19  Yes [provider]  traMADol (ULTRAM) 50 MG tablet Take 50 mg by mouth every 6 (six) hours as needed.    [provider]    Allergies as of 12/24/2018 - Review Complete 12/22/2018  Allergen Reaction Noted  . Penicillins Anaphylaxis 01/08/2018  . Morphine and related Itching and Swelling 01/08/2018  . Sulfur Itching and Swelling 01/08/2018    Family History  Adopted: Yes    Social History   Socioeconomic  History  . Marital status: Single    Spouse name: Not on file  . Number of children: Not on file  . Years of education: Not on file  . Highest education level: Not on file  Occupational History  . Not on file  Tobacco Use  . Smoking status: Current Every Day Smoker    Packs/day: 0.50    Years: 37.00    Pack years: 18.50    Types: Cigarettes  . Smokeless tobacco: Never Used  Substance and Sexual Activity  . Alcohol use: Not Currently    Comment: history of regular ETOH use but stopped a few years ago  . Drug use: Never  . Sexual activity: Yes  Other Topics Concern  . Not on file  Social History Narrative  . Not on file   Social Determinants of Health   Financial Resource Strain:   . Difficulty of Paying Living Expenses:   Food Insecurity:   . Worried About Charity fundraiser in the Last Year:   . Arboriculturist in the Last Year:   Transportation Needs:   . Film/video editor (Medical):   Marland Kitchen Lack of Transportation (Non-Medical):   Physical Activity:   . Days of Exercise per Week:   . Minutes of Exercise per Session:   Stress:   . Feeling of Stress :   Social Connections:   . Frequency of Communication with Friends and Family:   . Frequency of Social Gatherings with Friends and  Family:   . Attends Religious Services:   . Active Member of Clubs or Organizations:   . Attends Banker Meetings:   Marland Kitchen Marital Status:   Intimate Partner Violence:   . Fear of Current or Ex-Partner:   . Emotionally Abused:   Marland Kitchen Physically Abused:   . Sexually Abused:     Review of Systems: See HPI, otherwise negative ROS  Physical Exam: BP (!) 161/88   Pulse 82   Temp 98.7 F (37.1 C) (Oral)   Resp 18   SpO2 100%  General:   Alert,  Well-developed, well-nourished, pleasant and cooperative in NAD Neck:  Supple; no masses or thyromegaly. No significant cervical adenopathy. Lungs:  Clear throughout to auscultation.   No wheezes, crackles, or rhonchi. No acute  distress. Heart:  Regular rate and rhythm; no murmurs, clicks, rubs,  or gallops. Abdomen: Non-distended, normal bowel sounds.  Soft and nontender without appreciable mass or hepatosplenomegaly.  Pulses:  Normal pulses noted. Extremities:  Without clubbing or edema.  Impression/Plan: 54 year old gentleman with intermittent rectal bleeding.  No prior colonoscopy.  Colonoscopy now being done as a diagnostic maneuver. The risks, benefits, limitations, alternatives and imponderables have been reviewed with the patient. Questions have been answered. All parties are agreeable.

## 2019-07-06 NOTE — Addendum Note (Signed)
Addendum  created 07/06/19 1005 by Shanon Payor, CRNA   Charge Capture section accepted

## 2019-07-27 ENCOUNTER — Other Ambulatory Visit: Payer: Self-pay

## 2019-07-27 ENCOUNTER — Encounter (HOSPITAL_COMMUNITY): Payer: Self-pay

## 2019-07-27 ENCOUNTER — Other Ambulatory Visit (HOSPITAL_COMMUNITY): Payer: Self-pay | Admitting: Internal Medicine

## 2019-07-27 ENCOUNTER — Ambulatory Visit (HOSPITAL_COMMUNITY)
Admission: RE | Admit: 2019-07-27 | Discharge: 2019-07-27 | Disposition: A | Discharge status: Home / Self Care | Payer: BLUE CROSS/BLUE SHIELD | Source: Ambulatory Visit | Attending: Internal Medicine | Admitting: Internal Medicine

## 2019-07-27 DIAGNOSIS — M25552 Pain in left hip: Secondary | ICD-10-CM

## 2019-08-26 ENCOUNTER — Other Ambulatory Visit (HOSPITAL_COMMUNITY): Payer: Self-pay | Admitting: Orthopedic Surgery

## 2019-08-26 ENCOUNTER — Other Ambulatory Visit: Payer: Self-pay | Admitting: Orthopedic Surgery

## 2019-08-26 DIAGNOSIS — M25552 Pain in left hip: Secondary | ICD-10-CM

## 2019-09-17 ENCOUNTER — Ambulatory Visit (HOSPITAL_COMMUNITY): Payer: BLUE CROSS/BLUE SHIELD

## 2019-09-22 ENCOUNTER — Ambulatory Visit (HOSPITAL_COMMUNITY): Payer: BLUE CROSS/BLUE SHIELD

## 2019-09-22 ENCOUNTER — Ambulatory Visit: Payer: BLUE CROSS/BLUE SHIELD | Admitting: Gastroenterology

## 2019-09-22 ENCOUNTER — Encounter (HOSPITAL_COMMUNITY): Payer: Self-pay

## 2019-10-01 ENCOUNTER — Ambulatory Visit: Payer: BLUE CROSS/BLUE SHIELD | Admitting: Internal Medicine

## 2019-10-07 ENCOUNTER — Ambulatory Visit (HOSPITAL_COMMUNITY): Payer: BLUE CROSS/BLUE SHIELD

## 2019-10-29 NOTE — H&P (Signed)
Surgical History & Physical  Patient Name: Jordan Key DOB: 01/30/1966  Surgery: Cataract extraction with intraocular lens implant phacoemulsification; Right Eye  Surgeon: Fabio Pierce MD Surgery Date:  11/05/2019 Pre-Op Date:  10/28/2019  HPI: A 34 Yr. old male patient presents for a cataract evaluation of both eyes referred by Dr Harvie Bridge. The patient reports vision is very blurry. The patient states she feels like he is unable to perform his daily activities due to the decrease in vision. The patient does not feel comfortable driving at night time especially when there are bright headlights. This is negatively affecting the patient's quality of life. The patient reports vision began to decrease about a year ago (pre-COVID). The patient denies flashes of light, floaters, eye pain or headaches. This is negatively affecting the patient's quality of life.  Medical History: Cataracts Acid reflux Arthritis  Review of Systems Negative Allergic/Immunologic Negative Cardiovascular Negative Constitutional Negative Ear, Nose, Mouth & Throat Negative Endocrine Negative Eyes Negative Gastrointestinal Negative Genitourinary Negative Hemotologic/Lymphatic Negative Integumentary Negative Musculoskeletal Negative Neurological Negative Psychiatry Negative Respiratory  Social   Current every day smoker   Medication Nexium, Celecoxib,   Sx/Procedures Major blood clot sx, B leg arteries, Gallbladder Sx,   Drug Allergies  Sulfa (sulfonamide antibiotics), Pencillin, Morphine,   History & Physical: Heent:  Cataract, Right eye NECK: supple without bruits LUNGS: lungs clear to auscultation CV: regular rate and rhythm Abdomen: soft and non-tender  Impression & Plan: Assessment: 1.  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  COMBINED FORMS AGE RELATED CATARACT; Left Eye (H25.812) 3.  BLEPHARITIS; Right Upper Lid, Right Lower Lid, Left Upper Lid, Left Lower  Lid (H01.001, H01.002,H01.004,H01.005) 4.  DERMATOCHALASIS, no surgery; Right Upper Lid, Left Upper Lid (H02.831, G31.517)  Plan: 1.  Cataract accounts for the patient's decreased vision. This visual impairment is not correctable with a tolerable change in glasses or contact lenses. Cataract surgery with an implantation of a new lens should significantly improve the visual and functional status of the patient. Discussed all risks, benefits, alternatives, and potential complications. Discussed the procedures and recovery. Patient desires to have surgery. A-scan ordered and performed today for intra-ocular lens calculations. The surgery will be performed in order to improve vision for driving, reading, and for eye examinations. Recommend phacoemulsification with intra-ocular lens. Right Eye. Dilates poorly - shugacaine by protocol. Vision Blue in room [employer]. in room. [place]. 2.  Will address after right eye. 3.  Begin/continue lid scrubs. 4.  Asymptomatic, recommend observation for now. Findings, prognosis and treatment options reviewed.

## 2019-11-02 ENCOUNTER — Other Ambulatory Visit: Payer: Self-pay

## 2019-11-02 ENCOUNTER — Encounter (HOSPITAL_COMMUNITY): Payer: Self-pay

## 2019-11-02 ENCOUNTER — Encounter (HOSPITAL_COMMUNITY)
Admission: RE | Admit: 2019-11-02 | Discharge: 2019-11-02 | Disposition: A | Discharge status: Home / Self Care | Payer: BLUE CROSS/BLUE SHIELD | Source: Ambulatory Visit | Attending: Ophthalmology | Admitting: Ophthalmology

## 2019-11-04 ENCOUNTER — Other Ambulatory Visit: Payer: Self-pay

## 2019-11-04 ENCOUNTER — Other Ambulatory Visit (HOSPITAL_COMMUNITY)
Admission: RE | Admit: 2019-11-04 | Discharge: 2019-11-04 | Disposition: A | Discharge status: Home / Self Care | Payer: BLUE CROSS/BLUE SHIELD | Source: Ambulatory Visit | Attending: Ophthalmology | Admitting: Ophthalmology

## 2019-11-04 DIAGNOSIS — Z01812 Encounter for preprocedural laboratory examination: Secondary | ICD-10-CM | POA: Insufficient documentation

## 2019-11-04 DIAGNOSIS — Z20822 Contact with and (suspected) exposure to covid-19: Secondary | ICD-10-CM | POA: Diagnosis not present

## 2019-11-04 LAB — SARS CORONAVIRUS 2 (TAT 6-24 HRS): SARS Coronavirus 2: NEGATIVE

## 2019-11-04 NOTE — Progress Notes (Signed)
[facility] Covid Testing   To whom it may concern,  Luciano Cinquemani is excused from work today. Due to he has to stay quaraintined till after his surgery.  Sincerely,  Dimas Millin, CMA  Waynard Edwards RN

## 2019-11-05 ENCOUNTER — Encounter (HOSPITAL_COMMUNITY)
Admission: RE | Disposition: A | Discharge status: Home / Self Care | Payer: Self-pay | Source: Home / Self Care | Attending: Ophthalmology

## 2019-11-05 ENCOUNTER — Ambulatory Visit (HOSPITAL_COMMUNITY): Payer: BLUE CROSS/BLUE SHIELD | Admitting: Anesthesiology

## 2019-11-05 ENCOUNTER — Ambulatory Visit (HOSPITAL_COMMUNITY)
Admission: RE | Admit: 2019-11-05 | Discharge: 2019-11-05 | Disposition: A | Discharge status: Home / Self Care | Payer: BLUE CROSS/BLUE SHIELD | Attending: Ophthalmology | Admitting: Ophthalmology

## 2019-11-05 ENCOUNTER — Encounter (HOSPITAL_COMMUNITY): Payer: Self-pay | Admitting: Ophthalmology

## 2019-11-05 ENCOUNTER — Other Ambulatory Visit: Payer: Self-pay

## 2019-11-05 DIAGNOSIS — H2181 Floppy iris syndrome: Secondary | ICD-10-CM | POA: Diagnosis not present

## 2019-11-05 DIAGNOSIS — H25812 Combined forms of age-related cataract, left eye: Secondary | ICD-10-CM | POA: Insufficient documentation

## 2019-11-05 DIAGNOSIS — Z882 Allergy status to sulfonamides status: Secondary | ICD-10-CM | POA: Insufficient documentation

## 2019-11-05 DIAGNOSIS — K219 Gastro-esophageal reflux disease without esophagitis: Secondary | ICD-10-CM | POA: Diagnosis not present

## 2019-11-05 DIAGNOSIS — Z791 Long term (current) use of non-steroidal anti-inflammatories (NSAID): Secondary | ICD-10-CM | POA: Diagnosis not present

## 2019-11-05 DIAGNOSIS — Z79899 Other long term (current) drug therapy: Secondary | ICD-10-CM | POA: Diagnosis not present

## 2019-11-05 DIAGNOSIS — Z885 Allergy status to narcotic agent status: Secondary | ICD-10-CM | POA: Diagnosis not present

## 2019-11-05 DIAGNOSIS — H02831 Dermatochalasis of right upper eyelid: Secondary | ICD-10-CM | POA: Diagnosis not present

## 2019-11-05 DIAGNOSIS — H2521 Age-related cataract, morgagnian type, right eye: Secondary | ICD-10-CM | POA: Insufficient documentation

## 2019-11-05 DIAGNOSIS — Z880 Allergy status to penicillin: Secondary | ICD-10-CM | POA: Insufficient documentation

## 2019-11-05 DIAGNOSIS — H0100B Unspecified blepharitis left eye, upper and lower eyelids: Secondary | ICD-10-CM | POA: Diagnosis not present

## 2019-11-05 DIAGNOSIS — H0100A Unspecified blepharitis right eye, upper and lower eyelids: Secondary | ICD-10-CM | POA: Diagnosis not present

## 2019-11-05 DIAGNOSIS — H02834 Dermatochalasis of left upper eyelid: Secondary | ICD-10-CM | POA: Insufficient documentation

## 2019-11-05 DIAGNOSIS — M1990 Unspecified osteoarthritis, unspecified site: Secondary | ICD-10-CM | POA: Diagnosis not present

## 2019-11-05 DIAGNOSIS — F17210 Nicotine dependence, cigarettes, uncomplicated: Secondary | ICD-10-CM | POA: Insufficient documentation

## 2019-11-05 HISTORY — PX: CATARACT EXTRACTION W/PHACO: SHX586

## 2019-11-05 SURGERY — PHACOEMULSIFICATION, CATARACT, WITH IOL INSERTION
Anesthesia: Monitor Anesthesia Care | Site: Eye | Laterality: Right

## 2019-11-05 MED ORDER — FENTANYL CITRATE (PF) 100 MCG/2ML IJ SOLN
INTRAMUSCULAR | Status: AC
  Filled 2019-11-05: qty 2

## 2019-11-05 MED ORDER — LACTATED RINGERS IV SOLN: INTRAVENOUS | Status: DC

## 2019-11-05 MED ORDER — TETRACAINE HCL 0.5 % OP SOLN
1.0000 [drp] | OPHTHALMIC | Status: AC | PRN
  Administered 2019-11-05 (×3): 1 [drp] via OPHTHALMIC

## 2019-11-05 MED ORDER — PHENYLEPHRINE-KETOROLAC 1-0.3 % IO SOLN
INTRAOCULAR | Status: DC | PRN
  Administered 2019-11-05: 500 mL via OPHTHALMIC

## 2019-11-05 MED ORDER — LIDOCAINE HCL (PF) 1 % IJ SOLN
INTRAOCULAR | Status: DC | PRN
  Administered 2019-11-05: 1 mL via OPHTHALMIC

## 2019-11-05 MED ORDER — POVIDONE-IODINE 5 % OP SOLN
OPHTHALMIC | Status: DC | PRN
  Administered 2019-11-05: 1 via OPHTHALMIC

## 2019-11-05 MED ORDER — FENTANYL CITRATE (PF) 100 MCG/2ML IJ SOLN
INTRAMUSCULAR | Status: DC | PRN
  Administered 2019-11-05: 50 ug via INTRAVENOUS

## 2019-11-05 MED ORDER — PROVISC 10 MG/ML IO SOLN
INTRAOCULAR | Status: DC | PRN
  Administered 2019-11-05: 0.85 mL via INTRAOCULAR

## 2019-11-05 MED ORDER — TRYPAN BLUE 0.06 % OP SOLN
OPHTHALMIC | Status: DC | PRN
  Administered 2019-11-05: 0.5 mL via INTRAOCULAR

## 2019-11-05 MED ORDER — PHENYLEPHRINE-KETOROLAC 1-0.3 % IO SOLN
INTRAOCULAR | Status: AC
  Filled 2019-11-05: qty 4

## 2019-11-05 MED ORDER — MIDAZOLAM HCL 2 MG/2ML IJ SOLN
INTRAMUSCULAR | Status: AC
  Filled 2019-11-05: qty 2

## 2019-11-05 MED ORDER — MIDAZOLAM HCL 5 MG/5ML IJ SOLN
INTRAMUSCULAR | Status: DC | PRN
  Administered 2019-11-05: 2 mg via INTRAVENOUS

## 2019-11-05 MED ORDER — SODIUM HYALURONATE 23 MG/ML IO SOLN
INTRAOCULAR | Status: DC | PRN
  Administered 2019-11-05: 0.6 mL via INTRAOCULAR

## 2019-11-05 MED ORDER — CYCLOPENTOLATE-PHENYLEPHRINE 0.2-1 % OP SOLN
1.0000 [drp] | OPHTHALMIC | Status: AC | PRN
  Administered 2019-11-05 (×3): 1 [drp] via OPHTHALMIC

## 2019-11-05 MED ORDER — LIDOCAINE HCL 3.5 % OP GEL
1.0000 "application " | Freq: Once | OPHTHALMIC | Status: AC
  Administered 2019-11-05: 1 via OPHTHALMIC

## 2019-11-05 MED ORDER — TRYPAN BLUE 0.06 % OP SOLN
OPHTHALMIC | Status: AC
  Filled 2019-11-05: qty 0.5

## 2019-11-05 MED ORDER — PHENYLEPHRINE HCL 2.5 % OP SOLN
1.0000 [drp] | OPHTHALMIC | Status: AC | PRN
  Administered 2019-11-05 (×3): 1 [drp] via OPHTHALMIC

## 2019-11-05 MED ORDER — BSS IO SOLN
INTRAOCULAR | Status: DC | PRN
  Administered 2019-11-05: 15 mL via INTRAOCULAR

## 2019-11-05 SURGICAL SUPPLY — 14 items
CLOTH BEACON ORANGE TIMEOUT ST (SAFETY) ×2 IMPLANT
EYE SHIELD UNIVERSAL CLEAR (GAUZE/BANDAGES/DRESSINGS) ×2 IMPLANT
GLOVE BIOGEL PI IND STRL 7.0 (GLOVE) IMPLANT
GLOVE BIOGEL PI INDICATOR 7.0 (GLOVE) ×4
LENS ALC ACRYL/TECN (Ophthalmic Related) ×2 IMPLANT
NDL HYPO 18GX1.5 BLUNT FILL (NEEDLE) IMPLANT
NEEDLE HYPO 18GX1.5 BLUNT FILL (NEEDLE) ×3 IMPLANT
PAD ARMBOARD 7.5X6 YLW CONV (MISCELLANEOUS) ×2 IMPLANT
RING MALYGIN 7.0 (MISCELLANEOUS) ×2 IMPLANT
SYR TB 1ML LL NO SAFETY (SYRINGE) ×2 IMPLANT
TAPE SURG TRANSPORE 1 IN (GAUZE/BANDAGES/DRESSINGS) IMPLANT
TAPE SURGICAL TRANSPORE 1 IN (GAUZE/BANDAGES/DRESSINGS) ×3
VISCOELASTIC ADDITIONAL (OPHTHALMIC RELATED) ×2 IMPLANT
WATER STERILE IRR 250ML POUR (IV SOLUTION) ×2 IMPLANT

## 2019-11-05 NOTE — Interval H&P Note (Signed)
History and Physical Interval Note:  11/05/2019 10:26 AM  Jordan Key  has presented today for surgery, with the diagnosis of nuclear cataract right eye.  The various methods of treatment have been discussed with the patient and family. After consideration of risks, benefits and other options for treatment, the patient has consented to  Procedure(s) with comments: CATARACT EXTRACTION PHACO AND INTRAOCULAR LENS PLACEMENT (IOC) (Right) - CDE:  as a surgical intervention.  The patient's history has been reviewed, patient examined, no change in status, stable for surgery.  I have reviewed the patient's chart and labs.  Questions were answered to the patient's satisfaction.     Fabio Pierce

## 2019-11-05 NOTE — Anesthesia Postprocedure Evaluation (Signed)
Anesthesia Post Note  Patient: Jordan Key  Procedure(s) Performed: CATARACT EXTRACTION PHACO AND INTRAOCULAR LENS PLACEMENT RIGHT EYE (Right Eye)  Patient location during evaluation: Short Stay Anesthesia Type: MAC Level of consciousness: awake, oriented, awake and alert and patient cooperative Pain management: pain level controlled Vital Signs Assessment: post-procedure vital signs reviewed and stable Respiratory status: spontaneous breathing, respiratory function stable and nonlabored ventilation Cardiovascular status: blood pressure returned to baseline Postop Assessment: no headache and no backache Anesthetic complications: no   No complications documented.   Last Vitals:  Vitals:   11/05/19 0958  BP: (!) 125/64  Pulse: 75  Resp: 16  Temp: 36.7 C  SpO2: 95%    Last Pain:  Vitals:   11/05/19 0958  TempSrc: Oral  PainSc: 0-No pain                 Tacy Learn

## 2019-11-05 NOTE — Discharge Instructions (Signed)
Please discharge patient when stable, will follow up today with Dr. Talaya Lamprecht at the [facility] [place] office immediately following discharge.  Leave shield in place until visit.  All paperwork with discharge instructions will be given at the office.   [facility] Address:  [address]  [place], [place] [address]             Monitored Anesthesia Care, Care After These instructions provide you with information about caring for yourself after your procedure. Your health care provider may also give you more specific instructions. Your treatment has been planned according to current medical practices, but problems sometimes occur. Call your health care provider if you have any problems or questions after your procedure. What can I expect after the procedure? After your procedure, you may:  Feel sleepy for several hours.  Feel clumsy and have poor balance for several hours.  Feel forgetful about what happened after the procedure.  Have poor judgment for several hours.  Feel nauseous or vomit.  Have a sore throat if you had a breathing tube during the procedure. Follow these instructions at home: For at least 24 hours after the procedure:      Have a responsible adult stay with you. It is important to have someone help care for you until you are awake and alert.  Rest as needed.  Do not: ? Participate in activities in which you could fall or become injured. ? Drive. ? Use heavy machinery. ? Drink alcohol. ? Take sleeping pills or medicines that cause drowsiness. ? Make important decisions or sign legal documents. ? Take care of children on your own. Eating and drinking  Follow the diet that is recommended by your health care provider.  If you vomit, drink water, juice, or soup when you can drink without vomiting.  Make sure you have little or no nausea before eating solid foods. General instructions  Take over-the-counter and  prescription medicines only as told by your health care provider.  If you have sleep apnea, surgery and certain medicines can increase your risk for breathing problems. Follow instructions from your health care provider about wearing your sleep device: ? Anytime you are sleeping, including during daytime naps. ? While taking prescription pain medicines, sleeping medicines, or medicines that make you drowsy.  If you smoke, do not smoke without supervision.  Keep all follow-up visits as told by your health care provider. This is important. Contact a health care provider if:  You keep feeling nauseous or you keep vomiting.  You feel light-headed.  You develop a rash.  You have a fever. Get help right away if:  You have trouble breathing. Summary  For several hours after your procedure, you may feel sleepy and have poor judgment.  Have a responsible adult stay with you for at least 24 hours or until you are awake and alert. This information is not intended to replace advice given to you by your health care provider. Make sure you discuss any questions you have with your health care provider. Document Revised: 06/23/2017 Document Reviewed: 07/16/2015 Elsevier Patient Education  2020 [employer]  

## 2019-11-05 NOTE — Transfer of Care (Signed)
Immediate Anesthesia Transfer of Care Note  Patient: Jordan Key  Procedure(s) Performed: CATARACT EXTRACTION PHACO AND INTRAOCULAR LENS PLACEMENT RIGHT EYE (Right Eye)  Patient Location: PACU and Short Stay  Anesthesia Type:MAC  Level of Consciousness: awake, alert , oriented and patient cooperative  Airway & Oxygen Therapy: Patient Spontanous Breathing  Post-op Assessment: Report given to RN and Patient moving all extremities  Post vital signs: Reviewed and stable  Last Vitals:  Vitals Value Taken Time  BP    Temp    Pulse    Resp    SpO2      Last Pain:  Vitals:   11/05/19 0958  TempSrc: Oral  PainSc: 0-No pain         Complications: No complications documented.

## 2019-11-05 NOTE — Op Note (Signed)
Date of procedure: 11/05/19  Pre-operative diagnosis: Visually significant age-related mature cataract, Right Eye; Poor Dilation, Right Eye (H25.21; H21.81)  Post-operative diagnosis: Visually significant cataract, Right Eye; Intra-operative Floppy Iris Syndrome, Right Eye  Procedure: Removal of cataract via phacoemulsification and insertion of intra-ocular lens [employer] [id number]  +25.0D into the capsular bag of the Right Eye (CPT [phone])  Attending surgeon: Gerda Diss. Kimarie Coor, MD, MA  Anesthesia: MAC, Topical Akten  Complications: None  Estimated Blood Loss: <53m (minimal)  Specimens: None  Implants: As above  Indications:  Visually significant cataract, Right Eye  Procedure:  The patient was seen and identified in the pre-operative area. The operative eye was identified and dilated.  The operative eye was marked.  Topical anesthesia was administered to the operative eye.     The patient was then to the operative suite and placed in the supine position.  A timeout was performed confirming the patient, procedure to be performed, and all other relevant information.   The patient's face was prepped and draped in the usual fashion for intra-ocular surgery.  A lid speculum was placed into the operative eye and the surgical microscope moved into place and focused.  Poor dilation of the iris was confirmed.  A superotemporal paracentesis was created using a 20 gauge paracentesis blade.  Shugarcaine was injected into the anterior chamber. Vision Blue was used to stain the anterior capsule and was irrigated from the eye using BSS. Viscoelastic was injected into the anterior chamber.  A temporal clear-corneal main wound incision was created using a 2.432mmicrokeratome.  A Malyugin ring was placed.  A continuous curvilinear capsulorrhexis was initiated using an irrigating cystitome and completed using capsulorrhexis forceps.  Hydrodissection and hydrodeliniation were performed.   Viscoelastic was injected into the anterior chamber.  A phacoemulsification handpiece and a chopper as a second instrument were used to remove the nucleus and epinucleus. The irrigation/aspiration handpiece was used to remove any remaining cortical material.   The capsular bag was reinflated with viscoelastic, checked, and found to be intact.  The intraocular lens was inserted into the capsular bag and dialed into place using a Kuglen hook.  The Malyugin ring was removed.  The irrigation/aspiration handpiece was used to remove any remaining viscoelastic.  The clear corneal wound and paracentesis wounds were then hydrated and checked with Weck-Cels to be watertight.  The lid-speculum and drape was removed, and the patient's face was cleaned with a wet and dry 4x4.  A clear shield was taped over the eye. The patient was taken to the post-operative care unit in good condition, having tolerated the procedure well.  Post-Op Instructions: [facility] and will receive all other orders and instructions.

## 2019-11-05 NOTE — Anesthesia Preprocedure Evaluation (Signed)
Anesthesia Evaluation  Patient identified by MRN, date of birth, ID band Patient awake    Reviewed: Allergy & Precautions, NPO status , Patient's Chart, lab work & pertinent test results  History of Anesthesia Complications Negative for: history of anesthetic complications  Airway Mallampati: II  TM Distance: >3 FB Neck ROM: Full    Dental  (+) Missing, Dental Advisory Given, Edentulous Upper   Pulmonary Current Smoker (smoked 4 cigarettes this morning)Patient did not abstain from smoking.,    Pulmonary exam normal breath sounds clear to auscultation       Cardiovascular Exercise Tolerance: Good Normal cardiovascular exam Rhythm:Regular Rate:Normal     Neuro/Psych negative neurological ROS  negative psych ROS   GI/Hepatic Neg liver ROS, GERD  Medicated and Controlled,  Endo/Other  negative endocrine ROS  Renal/GU negative Renal ROS     Musculoskeletal  (+) Arthritis , Left hip pain   Abdominal   Peds  Hematology negative hematology ROS (+)   Anesthesia Other Findings   Reproductive/Obstetrics negative OB ROS                             Anesthesia Physical  Anesthesia Plan  ASA: II  Anesthesia Plan: MAC   Post-op Pain Management:    Induction: Intravenous  PONV Risk Score and Plan: Treatment may vary due to age or medical condition  Airway Management Planned: Nasal Cannula and Natural Airway  Additional Equipment:   Intra-op Plan:   Post-operative Plan:   Informed Consent: I have reviewed the patients History and Physical, chart, labs and discussed the procedure including the risks, benefits and alternatives for the proposed anesthesia with the patient or authorized representative who has indicated his/her understanding and acceptance.     Dental advisory given  Plan Discussed with: CRNA and Surgeon  Anesthesia Plan Comments:         Anesthesia Quick  Evaluation

## 2019-11-08 ENCOUNTER — Encounter (HOSPITAL_COMMUNITY): Payer: Self-pay | Admitting: Ophthalmology

## 2019-11-23 ENCOUNTER — Encounter (HOSPITAL_COMMUNITY): Admission: RE | Admit: 2019-11-23 | Payer: BLUE CROSS/BLUE SHIELD | Source: Ambulatory Visit

## 2019-11-24 ENCOUNTER — Other Ambulatory Visit (HOSPITAL_COMMUNITY)
Admission: RE | Admit: 2019-11-24 | Discharge: 2019-11-24 | Disposition: A | Discharge status: Home / Self Care | Payer: BLUE CROSS/BLUE SHIELD | Source: Ambulatory Visit | Attending: Ophthalmology | Admitting: Ophthalmology

## 2019-11-26 ENCOUNTER — Encounter (HOSPITAL_COMMUNITY): Admission: RE | Payer: Self-pay | Source: Home / Self Care

## 2019-11-26 ENCOUNTER — Ambulatory Visit (HOSPITAL_COMMUNITY): Admission: RE | Admit: 2019-11-26 | Payer: BLUE CROSS/BLUE SHIELD | Source: Home / Self Care | Admitting: Ophthalmology

## 2019-11-26 SURGERY — PHACOEMULSIFICATION, CATARACT, WITH IOL INSERTION
Anesthesia: Monitor Anesthesia Care | Laterality: Left

## 2020-07-10 ENCOUNTER — Other Ambulatory Visit (HOSPITAL_COMMUNITY): Payer: Self-pay | Admitting: Physician Assistant

## 2020-07-10 ENCOUNTER — Other Ambulatory Visit: Payer: Self-pay | Admitting: Physician Assistant

## 2020-07-17 ENCOUNTER — Other Ambulatory Visit (HOSPITAL_COMMUNITY): Payer: Self-pay | Admitting: Physician Assistant

## 2020-07-17 DIAGNOSIS — R231 Pallor: Secondary | ICD-10-CM

## 2020-07-31 ENCOUNTER — Encounter: Payer: Self-pay | Admitting: Internal Medicine

## 2020-08-02 ENCOUNTER — Ambulatory Visit (HOSPITAL_COMMUNITY)
Admission: RE | Admit: 2020-08-02 | Discharge: 2020-08-02 | Disposition: A | Discharge status: Home / Self Care | Payer: BLUE CROSS/BLUE SHIELD | Source: Ambulatory Visit | Attending: Physician Assistant | Admitting: Physician Assistant

## 2020-08-02 ENCOUNTER — Other Ambulatory Visit: Payer: Self-pay

## 2020-08-02 ENCOUNTER — Encounter (HOSPITAL_COMMUNITY): Payer: Self-pay

## 2020-08-02 DIAGNOSIS — R231 Pallor: Secondary | ICD-10-CM | POA: Diagnosis present

## 2020-09-26 ENCOUNTER — Encounter: Payer: Self-pay | Admitting: Internal Medicine

## 2020-09-26 ENCOUNTER — Ambulatory Visit: Payer: BLUE CROSS/BLUE SHIELD | Admitting: Internal Medicine

## 2020-11-02 ENCOUNTER — Encounter: Payer: Self-pay | Admitting: Internal Medicine

## 2020-12-26 ENCOUNTER — Encounter: Payer: Self-pay | Admitting: Internal Medicine

## 2020-12-26 ENCOUNTER — Ambulatory Visit: Payer: BLUE CROSS/BLUE SHIELD | Admitting: Internal Medicine

## 2021-02-16 DIAGNOSIS — M1991 Primary osteoarthritis, unspecified site: Secondary | ICD-10-CM | POA: Diagnosis not present

## 2021-02-16 DIAGNOSIS — Z6824 Body mass index (BMI) 24.0-24.9, adult: Secondary | ICD-10-CM | POA: Diagnosis not present

## 2021-03-12 DIAGNOSIS — K529 Noninfective gastroenteritis and colitis, unspecified: Secondary | ICD-10-CM | POA: Diagnosis not present

## 2021-04-05 DIAGNOSIS — M25552 Pain in left hip: Secondary | ICD-10-CM | POA: Diagnosis not present

## 2021-04-26 IMAGING — DX DG HIP (WITH OR WITHOUT PELVIS) 2-3V*L*
3 series · 3 of 3 positions shown · non-contrast
Comparison: None.

CLINICAL DATA: Left hip pain.

EXAM:
DG HIP (WITH OR WITHOUT PELVIS) 2-3V LEFT

[hip lat]
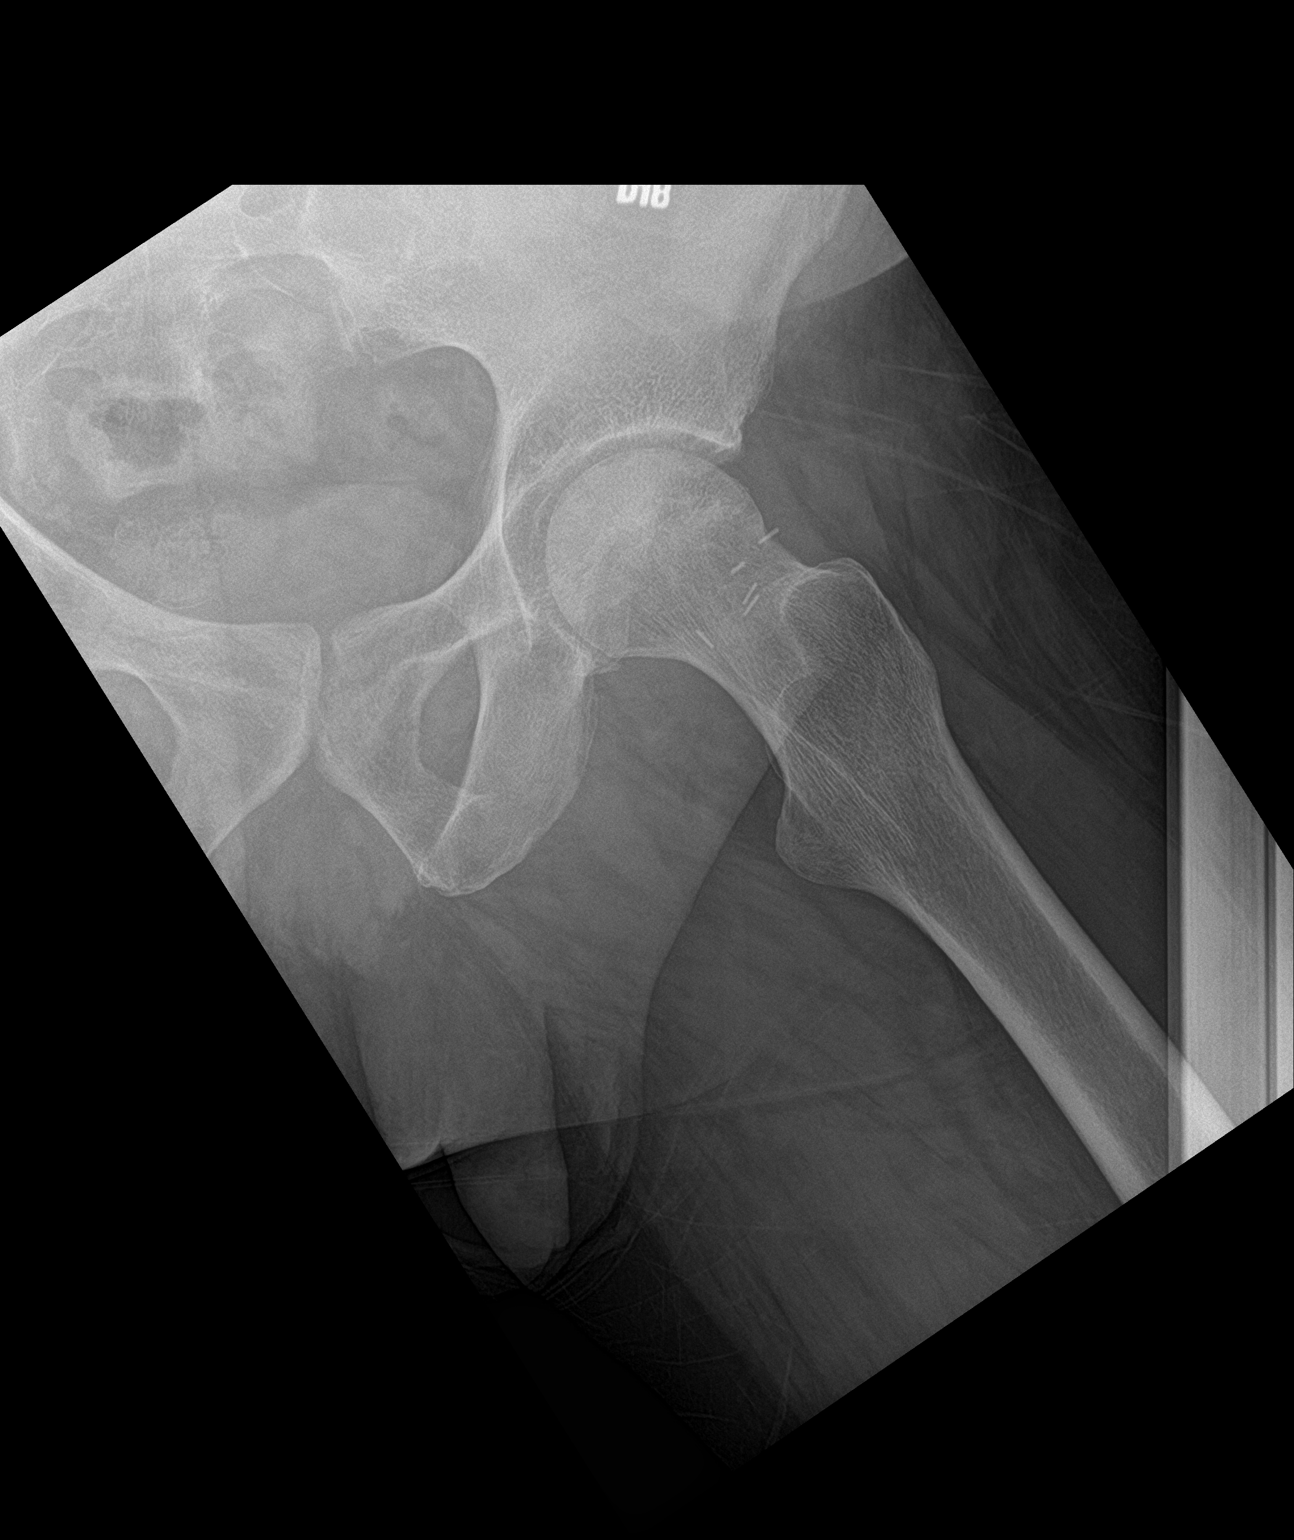

[pelvis ap]
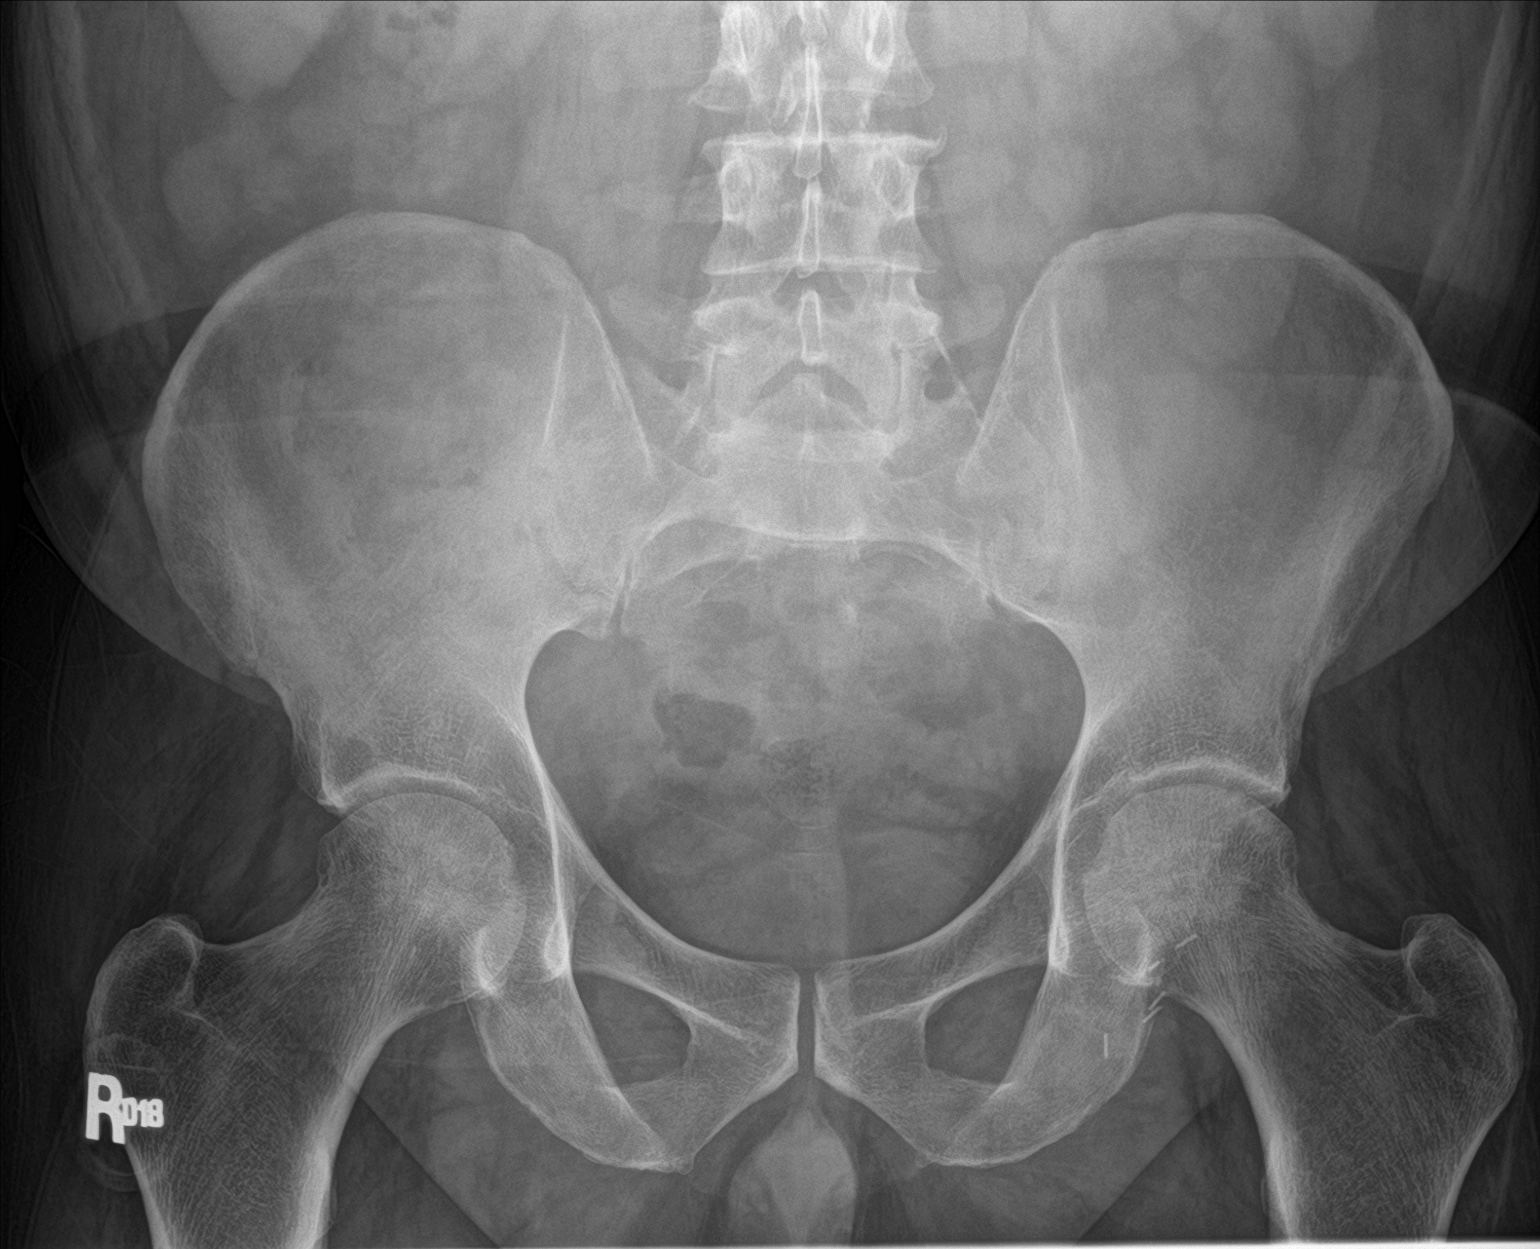

[hip ap]
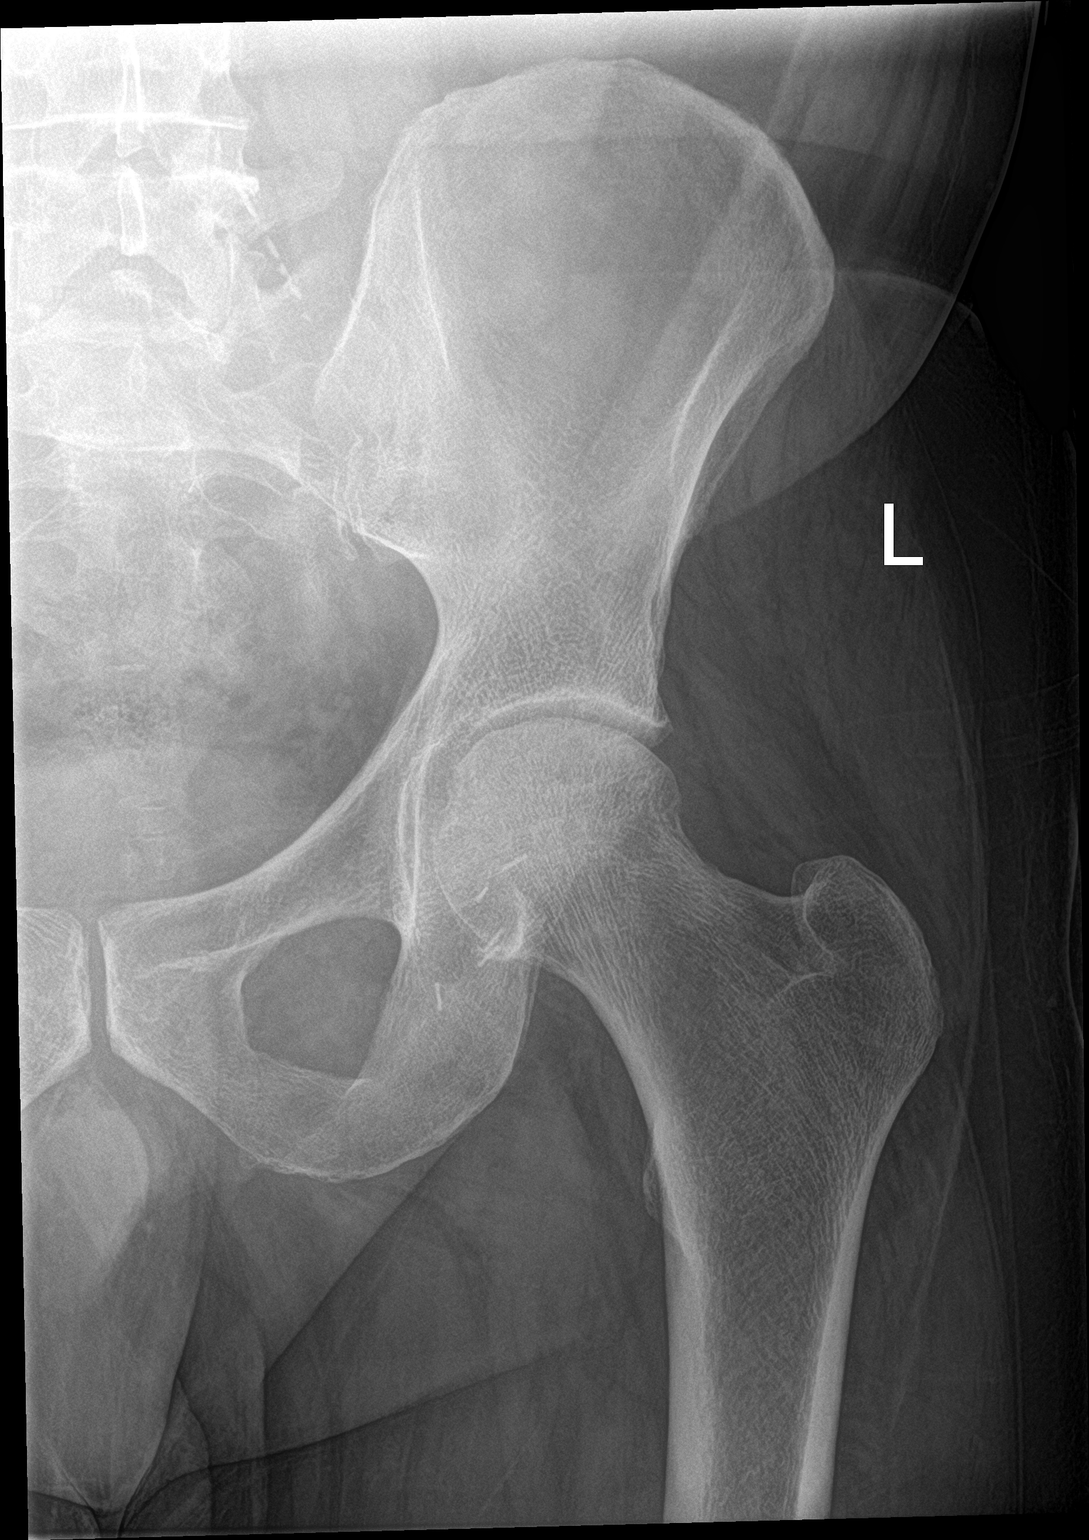

[3 of 3 positions shown; findings below may reference images not displayed]

FINDINGS: No fracture or dislocation. Minimal osteoarthritis of both hip
joints appears fairly symmetric on the pelvic film. No bony lesions
or destruction. Visualized soft tissues are unremarkable. There are
some clips in the left inguinal region related to prior surgery.
IMPRESSION: Minimal osteoarthritis of both hip joints.

## 2021-05-02 DIAGNOSIS — J22 Unspecified acute lower respiratory infection: Secondary | ICD-10-CM | POA: Diagnosis not present

## 2021-05-02 DIAGNOSIS — Z6824 Body mass index (BMI) 24.0-24.9, adult: Secondary | ICD-10-CM | POA: Diagnosis not present

## 2021-05-02 DIAGNOSIS — E663 Overweight: Secondary | ICD-10-CM | POA: Diagnosis not present

## 2021-05-02 DIAGNOSIS — R600 Localized edema: Secondary | ICD-10-CM | POA: Diagnosis not present

## 2022-05-03 IMAGING — US US AORTA/IVC/ILIACS DOPPLER LTD
1 series · 14 of 25 positions shown · non-contrast
Comparison: None.

CLINICAL DATA: 54-year-old male with decreased lower extremity
pulses

EXAM:
ULTRASOUND OF ABDOMINAL AORTA
TECHNIQUE: Ultrasound examination of the abdominal aorta was performed to
evaluate for abdominal aortic aneurysm.

[Series 1: us aorta/ivc/iliacs doppler ltd · 0.25mm/px · 14 of 36 slices shown]
[im 1/36]
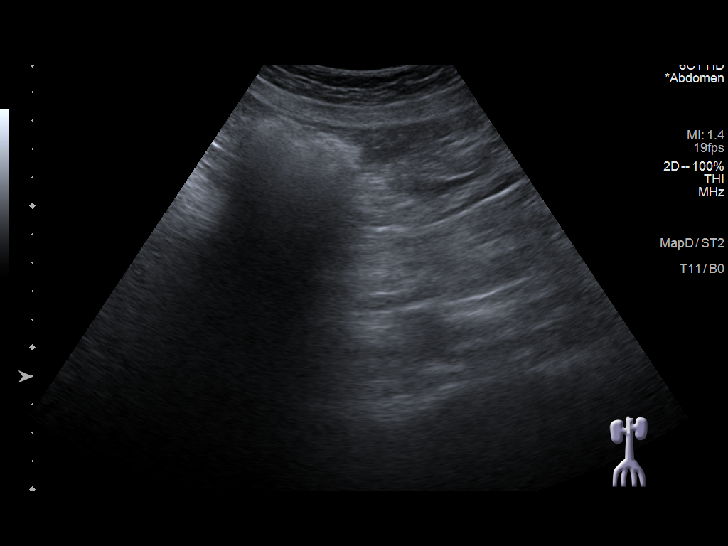
[im 3/36]
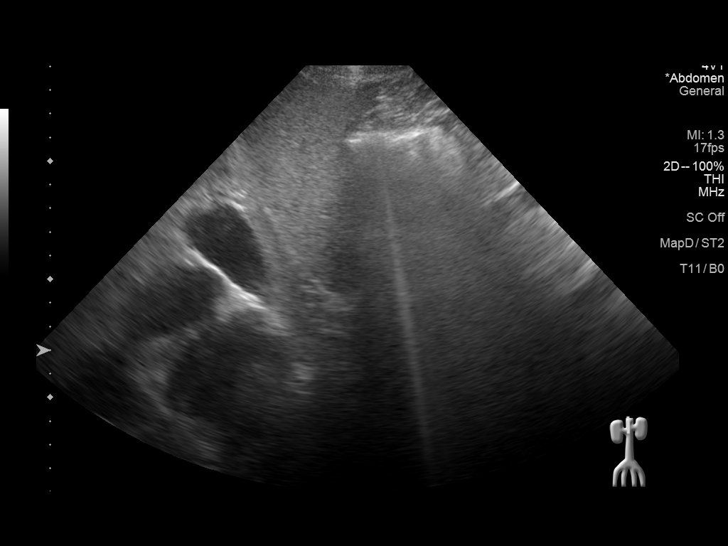
[im 6/36]
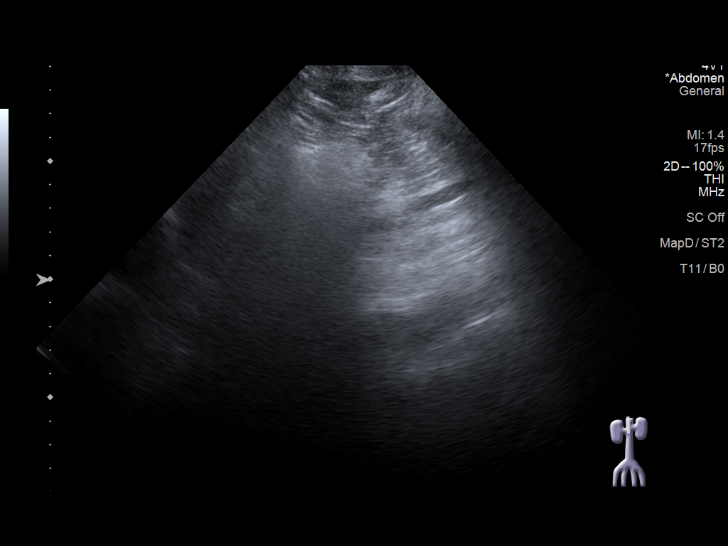
[im 9/36]
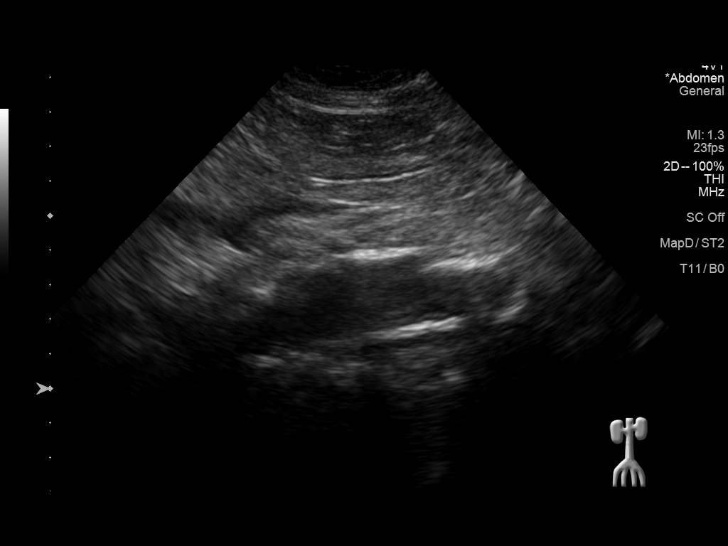
[im 12/36]
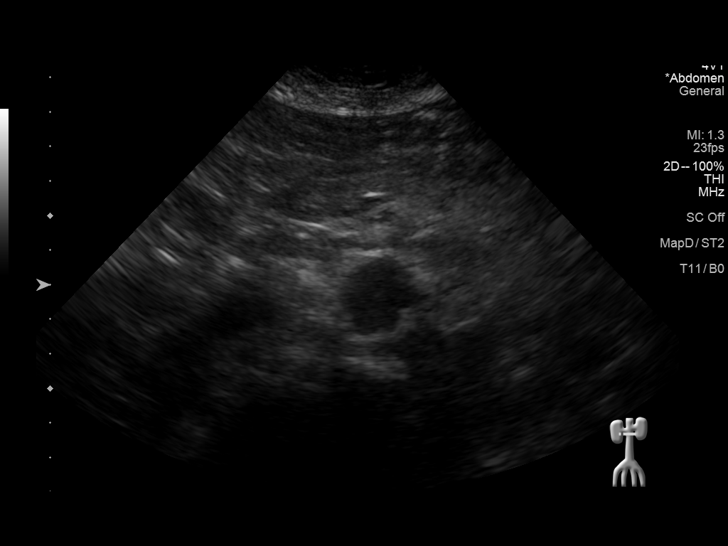
[im 14/36]
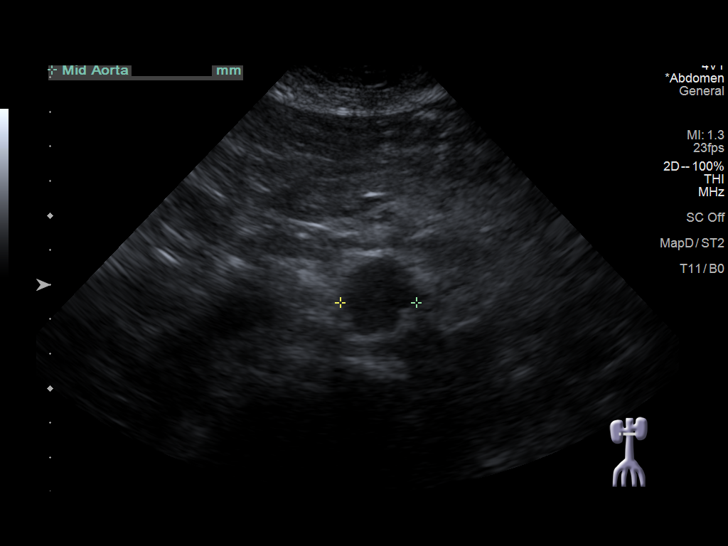
[im 17/36]
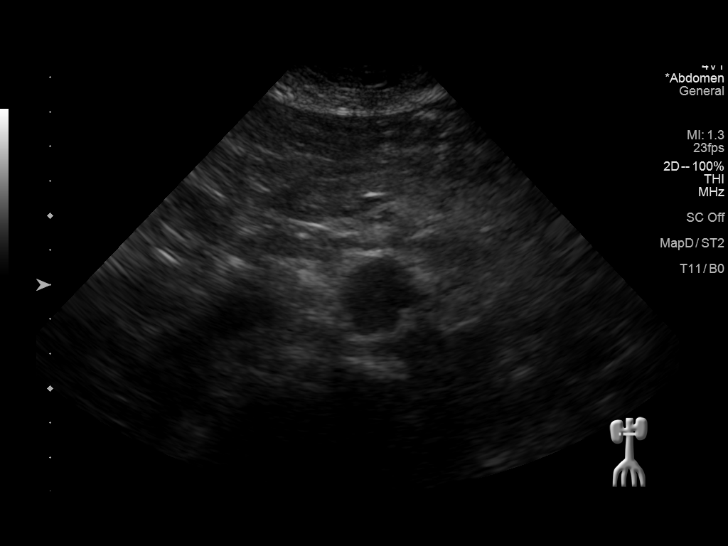
[im 19/36]
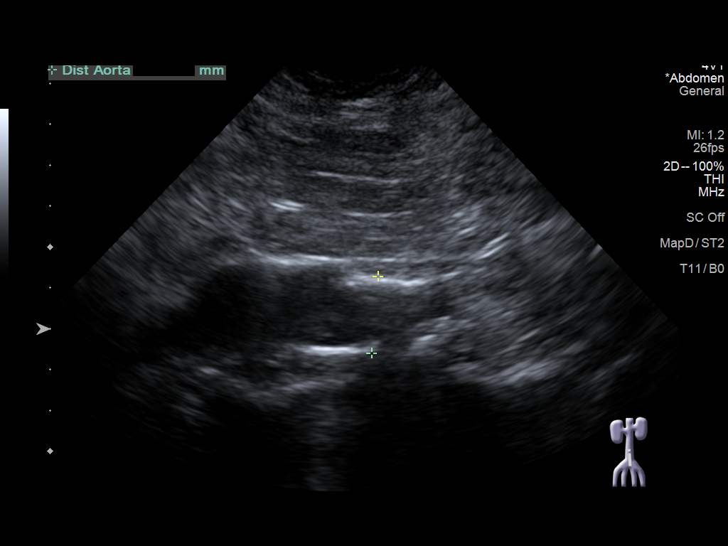
[im 22/36]
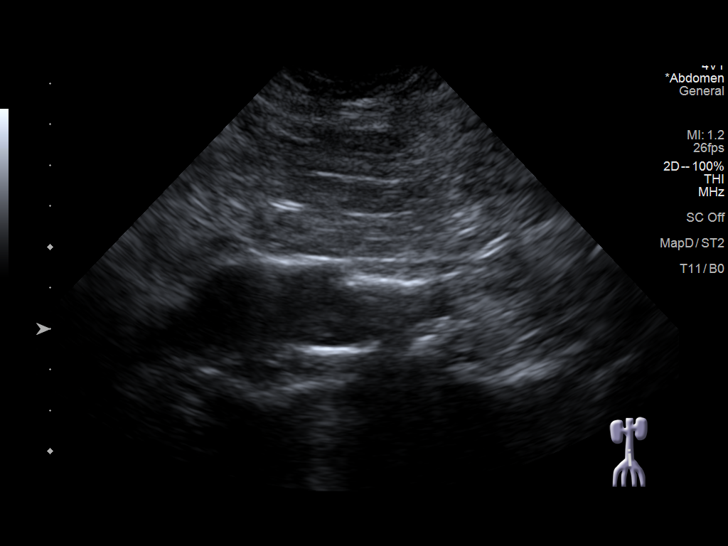
[im 24/36]
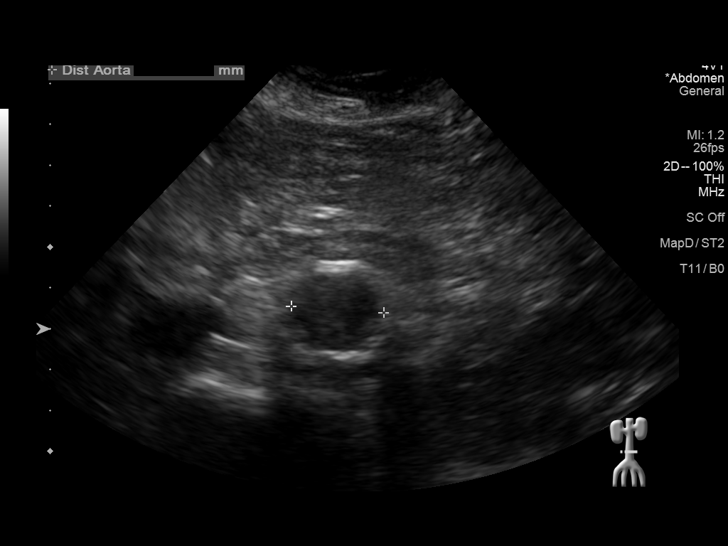
[im 27/36]
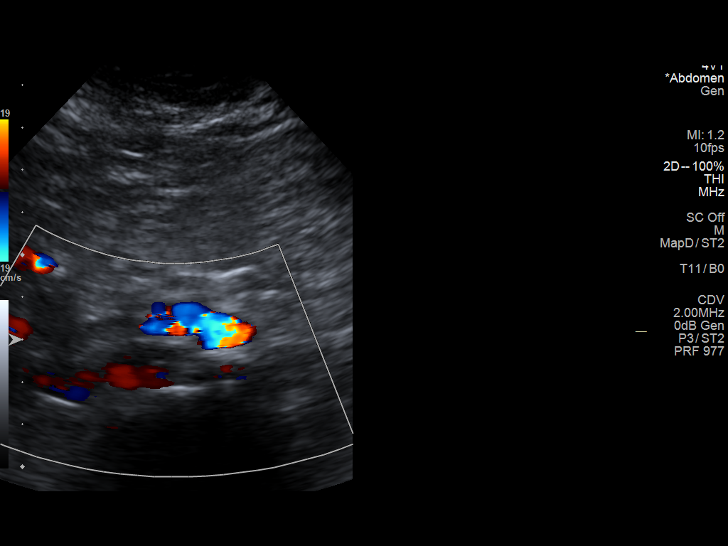
[im 30/36]
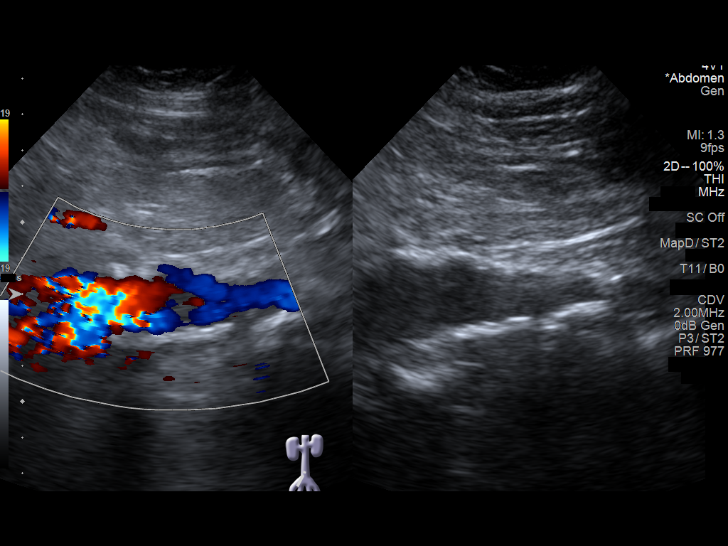
[im 33/36]
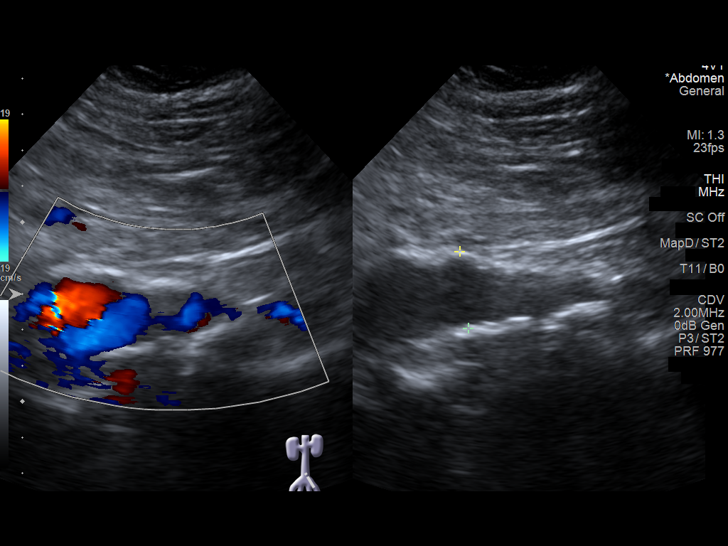
[im 36/36]
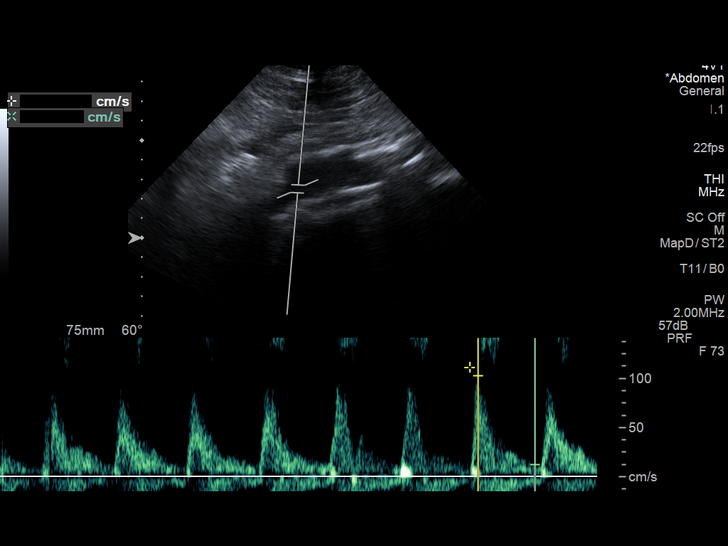

[14 of 25 positions shown; findings below may reference images not displayed]

FINDINGS: Abdominal aortic measurements as follows:

Proximal: Not visualized

Mid:  2.8 cm

Distal:  2.3 cm

Right iliac artery: 1.7 cm

Left iliac artery: 2.2 cm
IMPRESSION: Recommend follow-up every 5 years. This recommendation follows ACR
consensus guidelines: White Paper of the ACR Incidental Findings
Committee II on Vascular Findings. [HOSPITAL] 0311;
[DATE].

Ectasia of the bilateral iliac arteries

## 2022-05-03 IMAGING — US US EXTREM LOW ARTERIAL SEG MULTIPLE*L*
1 series · 14 of 25 positions shown · non-contrast
Comparison: None.

CLINICAL DATA: 54-year-old male with livido reticularis

EXAM:
LEFT LOWER EXTREMITY ARTERIAL DUPLEX SCAN
TECHNIQUE: Gray-scale sonography as well as color Doppler and duplex ultrasound
was performed to evaluate the lower extremity arteries including the
common, superficial and profunda femoral arteries, popliteal artery
and calf arteries.

[Series 1: us extrem low arterial seg multiple*left* · 0.06mm/px · 14 of 74 slices shown]
[im 1/74]
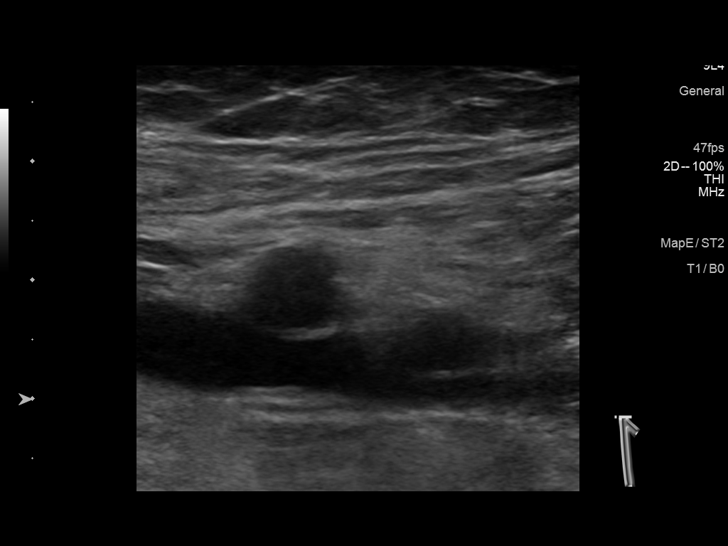
[im 7/74]
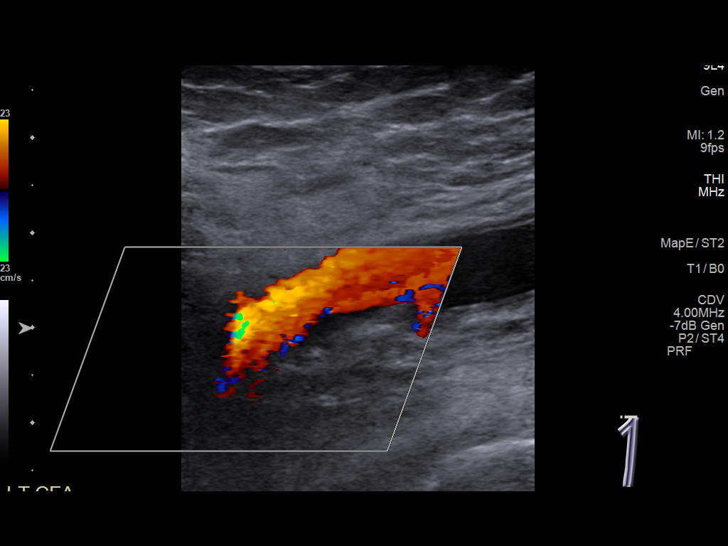
[im 13/74]
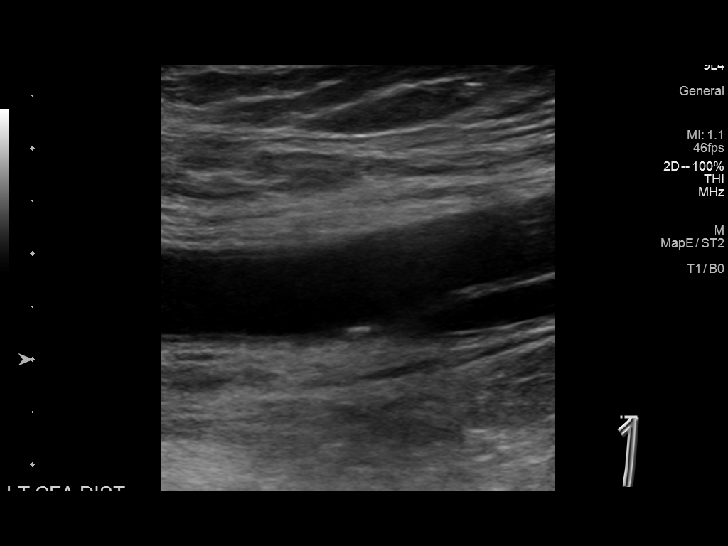
[im 19/74]
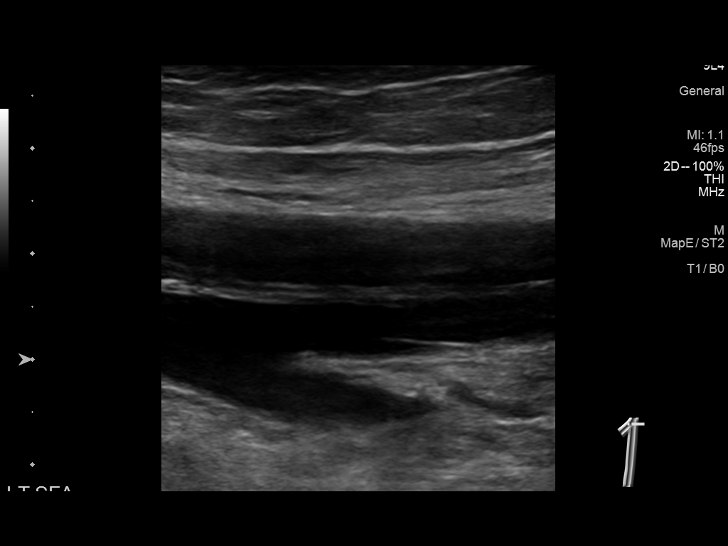
[im 25/74]
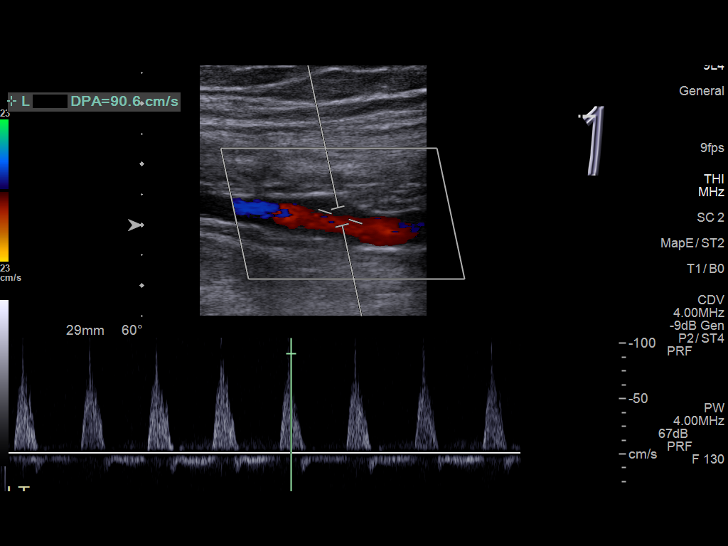
[im 28/74]
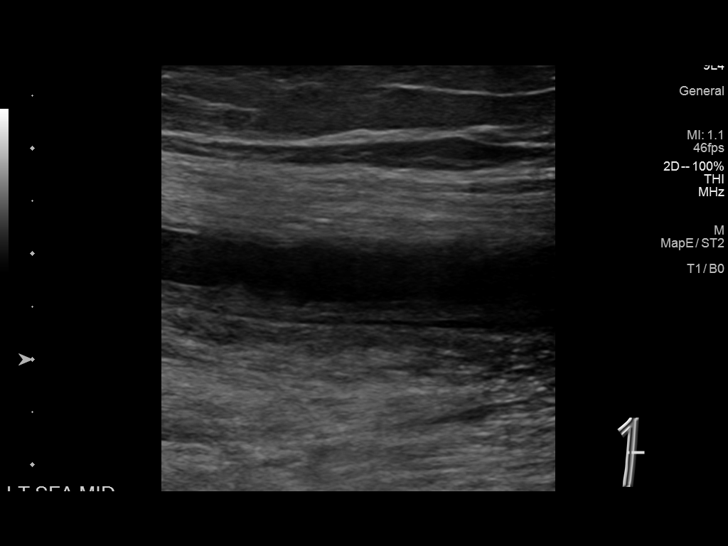
[im 34/74]
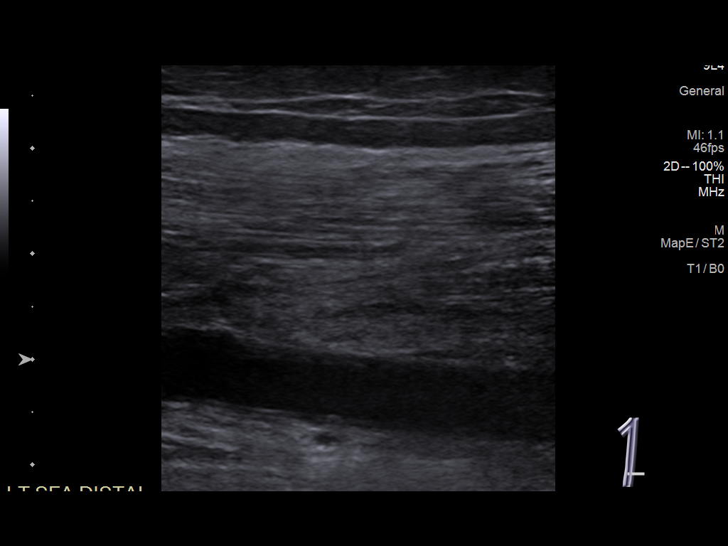
[im 40/74]
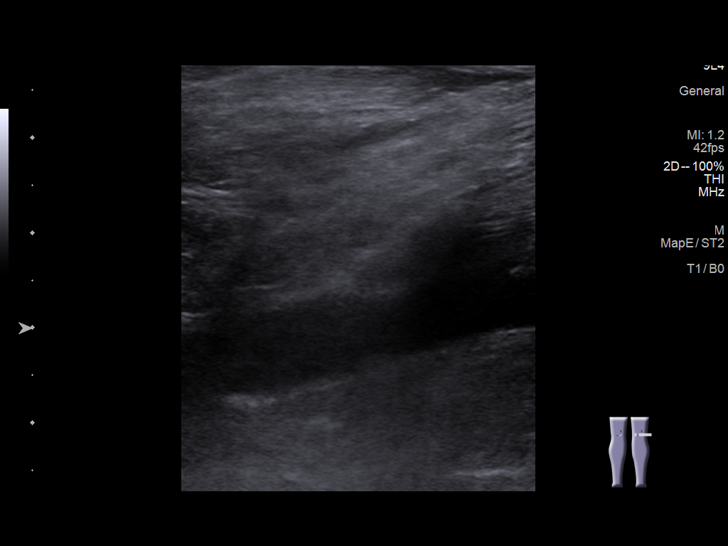
[im 46/74]
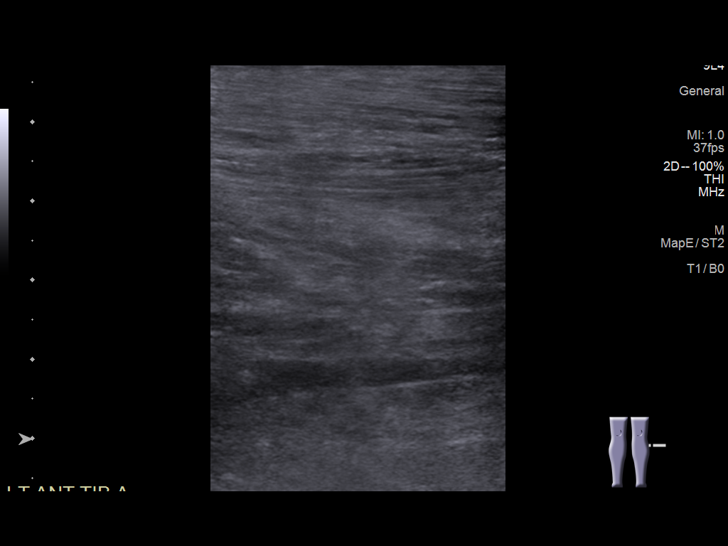
[im 49/74]
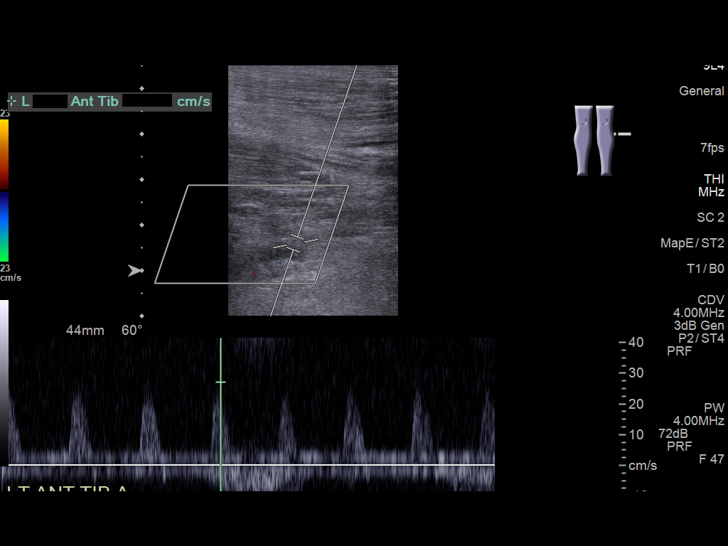
[im 55/74]
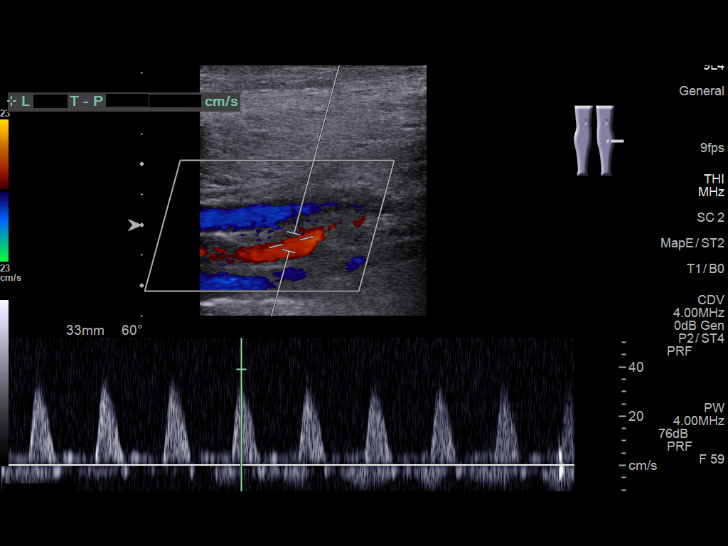
[im 61/74]
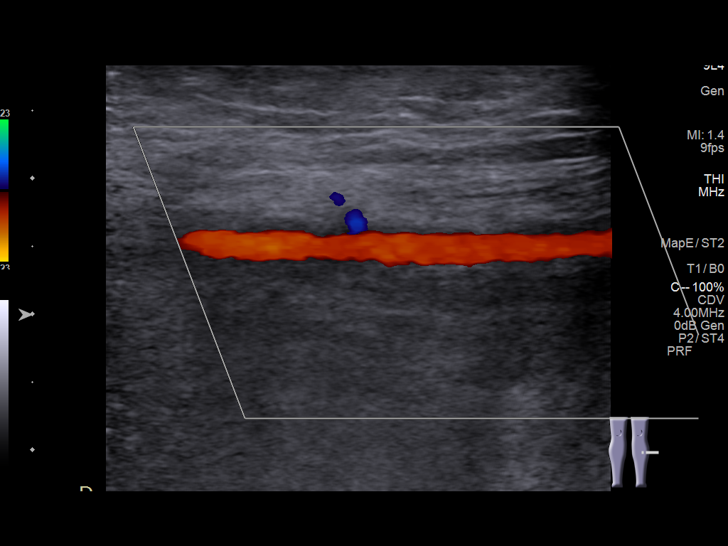
[im 67/74]
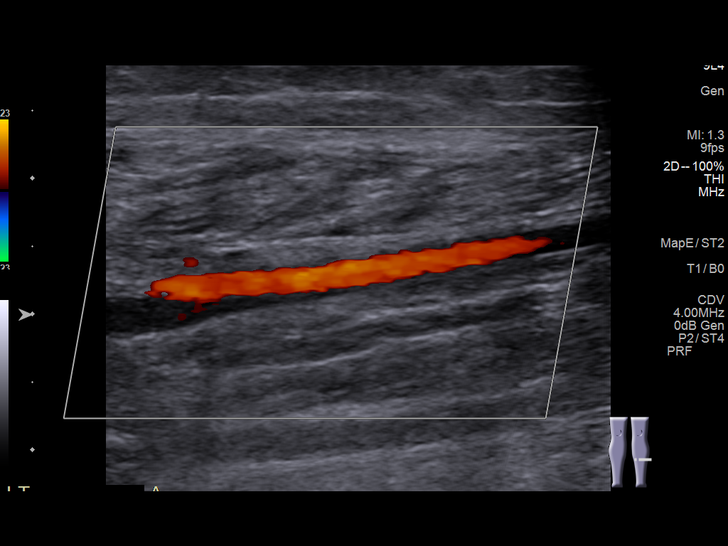
[im 74/74]
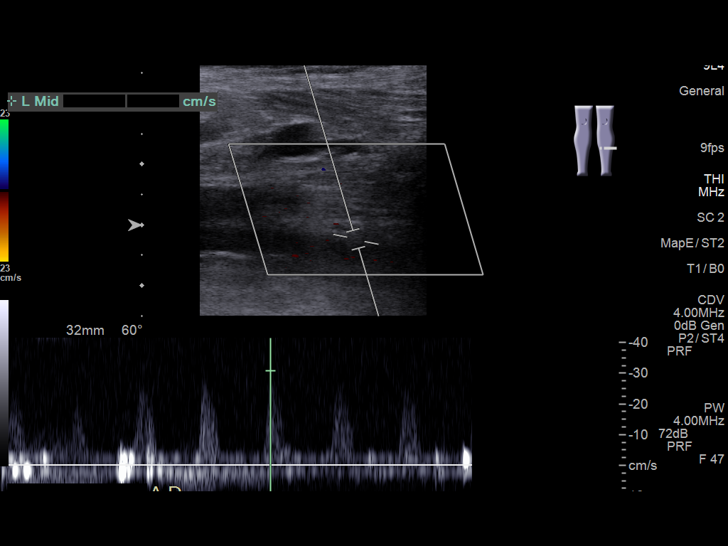

[14 of 25 positions shown; findings below may reference images not displayed]

FINDINGS: Left lower Extremity

Directed duplex of the left lower extremity demonstrates triphasic
common femoral artery, profunda femoris, SFA. Triphasic popliteal
artery. Triphasic posterior tibial artery distally. Biphasic
peroneal artery proximally. Monophasic peroneal artery distally
IMPRESSION: Directed duplex left lower extremity demonstrates maintained
waveforms through the femoropopliteal segment.

The duplex demonstrates at least 1 tibial artery with waveforms
maintained to the distal segment. Note this is labeled posterior
tibial artery, with discordant appearance of the waveform from the
recent segmental Doppler
